# Patient Record
Sex: Male | Born: 1955 | Race: Black or African American | Hispanic: No | Marital: Married | Smoking: Light tobacco smoker
Health system: Southern US, Community
[De-identification: ages and names within clinical notes are randomized; demographics above are authoritative.]

## PROBLEM LIST (undated history)

## (undated) DIAGNOSIS — I2510 Atherosclerotic heart disease of native coronary artery without angina pectoris: Secondary | ICD-10-CM

## (undated) DIAGNOSIS — R7302 Impaired glucose tolerance (oral): Secondary | ICD-10-CM

## (undated) DIAGNOSIS — I10 Essential (primary) hypertension: Secondary | ICD-10-CM

## (undated) DIAGNOSIS — E785 Hyperlipidemia, unspecified: Secondary | ICD-10-CM

## (undated) DIAGNOSIS — N529 Male erectile dysfunction, unspecified: Secondary | ICD-10-CM

## (undated) HISTORY — DX: Impaired glucose tolerance (oral): R73.02

## (undated) HISTORY — DX: Hyperlipidemia, unspecified: E78.5

## (undated) HISTORY — DX: Male erectile dysfunction, unspecified: N52.9

## (undated) HISTORY — DX: Essential (primary) hypertension: I10

## (undated) HISTORY — DX: Atherosclerotic heart disease of native coronary artery without angina pectoris: I25.10

---

## 2008-11-05 ENCOUNTER — Emergency Department (HOSPITAL_COMMUNITY): Admission: EM | Admit: 2008-11-05 | Discharge: 2008-11-05 | Payer: Self-pay | Admitting: Emergency Medicine

## 2010-11-06 ENCOUNTER — Inpatient Hospital Stay (HOSPITAL_COMMUNITY)
Admission: RE | Admit: 2010-11-06 | Discharge: 2010-11-09 | Payer: Self-pay | Source: Home / Self Care | Attending: Cardiovascular Disease | Admitting: Cardiovascular Disease

## 2010-11-08 LAB — LIPID PANEL
Cholesterol: 225 mg/dL — ABNORMAL HIGH (ref 0–200)
HDL: 47 mg/dL (ref >39)
LDL Cholesterol: 145 mg/dL — ABNORMAL HIGH (ref 0–99)
Total CHOL/HDL Ratio: 4.8 RATIO
Triglycerides: 163 mg/dL — ABNORMAL HIGH (ref <150)
VLDL: 33 mg/dL (ref 0–40)

## 2010-11-08 LAB — TSH: TSH: 0.402 u[IU]/mL (ref 0.350–4.500)

## 2010-11-08 LAB — BASIC METABOLIC PANEL
BUN: 10 mg/dL (ref 6–23)
BUN: 10 mg/dL (ref 6–23)
CO2: 24 mEq/L (ref 19–32)
CO2: 26 mEq/L (ref 19–32)
Calcium: 8.8 mg/dL (ref 8.4–10.5)
Calcium: 8.9 mg/dL (ref 8.4–10.5)
Chloride: 108 mEq/L (ref 96–112)
Chloride: 109 mEq/L (ref 96–112)
Creatinine, Ser: 1.22 mg/dL (ref 0.4–1.5)
Creatinine, Ser: 1.34 mg/dL (ref 0.4–1.5)
GFR calc Af Amer: >60 mL/min (ref >60)
GFR calc Af Amer: >60 mL/min (ref >60)
GFR calc non Af Amer: >60 mL/min (ref >60)
GFR calc non Af Amer: 56 mL/min — ABNORMAL LOW (ref >60)
Glucose, Bld: 97 mg/dL (ref 70–99)
Glucose, Bld: 137 mg/dL — ABNORMAL HIGH (ref 70–99)
Potassium: 3.8 mEq/L (ref 3.5–5.1)
Potassium: 3.4 mEq/L — ABNORMAL LOW (ref 3.5–5.1)
Sodium: 138 mEq/L (ref 135–145)
Sodium: 140 mEq/L (ref 135–145)

## 2010-11-08 LAB — PLATELET COUNT: Platelets: 202 10*3/uL (ref 150–400)

## 2010-11-08 LAB — MRSA PCR SCREENING: MRSA by PCR: NEGATIVE

## 2010-11-08 LAB — CBC
HCT: 45.5 % (ref 39.0–52.0)
Hemoglobin: 15.3 g/dL (ref 13.0–17.0)
MCH: 27.1 pg (ref 26.0–34.0)
MCHC: 33.6 g/dL (ref 30.0–36.0)
MCV: 80.7 fL (ref 78.0–100.0)
Platelets: 192 10*3/uL (ref 150–400)
RBC: 5.64 MIL/uL (ref 4.22–5.81)
RDW: 14.4 % (ref 11.5–15.5)
WBC: 8.7 10*3/uL (ref 4.0–10.5)

## 2010-11-08 LAB — HEMOGLOBIN A1C
Hgb A1c MFr Bld: 5.9 % — ABNORMAL HIGH (ref <5.7)
Mean Plasma Glucose: 123 mg/dL — ABNORMAL HIGH (ref <117)

## 2010-11-08 LAB — TROPONIN I: Troponin I: 71 ng/mL (ref 0.00–0.06)

## 2010-11-08 LAB — CK TOTAL AND CKMB (NOT AT ARMC)
CK, MB: 245.1 ng/mL (ref 0.3–4.0)
Relative Index: 11 — ABNORMAL HIGH (ref 0.0–2.5)
Total CK: 2220 U/L — ABNORMAL HIGH (ref 7–232)

## 2010-11-10 LAB — PROTIME-INR
INR: 1.13 (ref 0.00–1.49)
Prothrombin Time: 14.7 seconds (ref 11.6–15.2)

## 2010-11-10 LAB — CBC
HCT: 45.3 % (ref 39.0–52.0)
HCT: 44.5 % (ref 39.0–52.0)
Hemoglobin: 15.1 g/dL (ref 13.0–17.0)
Hemoglobin: 14.5 g/dL (ref 13.0–17.0)
MCH: 27.1 pg (ref 26.0–34.0)
MCH: 26.6 pg (ref 26.0–34.0)
MCHC: 33.3 g/dL (ref 30.0–36.0)
MCHC: 32.6 g/dL (ref 30.0–36.0)
MCV: 81.3 fL (ref 78.0–100.0)
MCV: 81.7 fL (ref 78.0–100.0)
Platelets: 187 10*3/uL (ref 150–400)
Platelets: 204 10*3/uL (ref 150–400)
RBC: 5.57 MIL/uL (ref 4.22–5.81)
RBC: 5.45 MIL/uL (ref 4.22–5.81)
RDW: 14.6 % (ref 11.5–15.5)
RDW: 14.3 % (ref 11.5–15.5)
WBC: 7.9 10*3/uL (ref 4.0–10.5)
WBC: 8 10*3/uL (ref 4.0–10.5)

## 2010-11-10 LAB — CARDIAC PANEL(CRET KIN+CKTOT+MB+TROPI)
CK, MB: 20.6 ng/mL (ref 0.3–4.0)
Relative Index: 4.2 — ABNORMAL HIGH (ref 0.0–2.5)
Total CK: 485 U/L — ABNORMAL HIGH (ref 7–232)
Troponin I: 17.96 ng/mL (ref 0.00–0.06)

## 2010-11-10 LAB — POCT I-STAT, CHEM 8
BUN: 15 mg/dL (ref 6–23)
Calcium, Ion: 1.11 mmol/L — ABNORMAL LOW (ref 1.12–1.32)
Chloride: 102 mEq/L (ref 96–112)
Creatinine, Ser: 0.9 mg/dL (ref 0.4–1.5)
Glucose, Bld: 97 mg/dL (ref 70–99)
HCT: 49 % (ref 39.0–52.0)
Hemoglobin: 16.7 g/dL (ref 13.0–17.0)
Potassium: 3 mEq/L — ABNORMAL LOW (ref 3.5–5.1)
Sodium: 136 mEq/L (ref 135–145)
TCO2: 20 mmol/L (ref 0–100)

## 2010-11-10 LAB — BASIC METABOLIC PANEL
BUN: 14 mg/dL (ref 6–23)
BUN: 12 mg/dL (ref 6–23)
CO2: 24 mEq/L (ref 19–32)
CO2: 28 mEq/L (ref 19–32)
Calcium: 8.6 mg/dL (ref 8.4–10.5)
Calcium: 8.9 mg/dL (ref 8.4–10.5)
Chloride: 107 mEq/L (ref 96–112)
Chloride: 108 mEq/L (ref 96–112)
Creatinine, Ser: 1.25 mg/dL (ref 0.4–1.5)
Creatinine, Ser: 1.27 mg/dL (ref 0.4–1.5)
GFR calc Af Amer: >60 mL/min (ref >60)
GFR calc Af Amer: >60 mL/min (ref >60)
GFR calc non Af Amer: >60 mL/min (ref >60)
GFR calc non Af Amer: 60 mL/min — ABNORMAL LOW (ref >60)
Glucose, Bld: 112 mg/dL — ABNORMAL HIGH (ref 70–99)
Glucose, Bld: 111 mg/dL — ABNORMAL HIGH (ref 70–99)
Potassium: 3.5 mEq/L (ref 3.5–5.1)
Potassium: 3.6 mEq/L (ref 3.5–5.1)
Sodium: 136 mEq/L (ref 135–145)
Sodium: 141 mEq/L (ref 135–145)

## 2010-11-11 ENCOUNTER — Encounter: Payer: Self-pay | Admitting: Cardiovascular Disease

## 2010-11-12 DIAGNOSIS — Z720 Tobacco use: Secondary | ICD-10-CM | POA: Insufficient documentation

## 2010-11-12 DIAGNOSIS — F17200 Nicotine dependence, unspecified, uncomplicated: Secondary | ICD-10-CM | POA: Insufficient documentation

## 2010-11-12 DIAGNOSIS — I10 Essential (primary) hypertension: Secondary | ICD-10-CM | POA: Insufficient documentation

## 2010-11-15 ENCOUNTER — Ambulatory Visit
Admission: RE | Admit: 2010-11-15 | Discharge: 2010-11-15 | Payer: Self-pay | Source: Home / Self Care | Attending: Physician Assistant | Admitting: Physician Assistant

## 2010-11-15 ENCOUNTER — Encounter: Payer: Self-pay | Admitting: Physician Assistant

## 2010-11-15 ENCOUNTER — Other Ambulatory Visit: Payer: Self-pay | Admitting: Physician Assistant

## 2010-11-15 DIAGNOSIS — I252 Old myocardial infarction: Secondary | ICD-10-CM | POA: Insufficient documentation

## 2010-11-15 DIAGNOSIS — I2510 Atherosclerotic heart disease of native coronary artery without angina pectoris: Secondary | ICD-10-CM | POA: Insufficient documentation

## 2010-11-15 DIAGNOSIS — E785 Hyperlipidemia, unspecified: Secondary | ICD-10-CM | POA: Insufficient documentation

## 2010-11-16 LAB — BASIC METABOLIC PANEL
BUN: 16 mg/dL (ref 6–23)
CO2: 27 mEq/L (ref 19–32)
Calcium: 9.2 mg/dL (ref 8.4–10.5)
Chloride: 108 mEq/L (ref 96–112)
Creatinine, Ser: 1.4 mg/dL (ref 0.4–1.5)
GFR: 70.64 mL/min (ref >60.00)
Glucose, Bld: 82 mg/dL (ref 70–99)
Potassium: 4.4 mEq/L (ref 3.5–5.1)
Sodium: 140 mEq/L (ref 135–145)

## 2010-11-23 NOTE — Procedures (Signed)
NAME:  Peter Skinner, Peter Skinner               ACCOUNT NO.:  [id number]  MEDICAL RECORD NO.:  [id number]          PATIENT TYPE:  OIB  LOCATION:  2910                         FACILITY:  [facility]  PHYSICIAN:  Veverly Fells. Excell Seltzer, MD  DATE OF BIRTH:  [birth year]  DATE OF PROCEDURE:  11/06/2010 DATE OF DISCHARGE:                           CARDIAC CATHETERIZATION   PROCEDURES: 1. Left heart catheterization. 2. Selective coronary angiography. 3. Left ventricular angiography. 4. Percutaneous transluminal coronary angioplasty and stenting of the     left anterior descending artery.  PROCEDURAL INDICATIONS:  Peter Skinner is a 55 year old gentleman with no past cardiac history who presented with an anterolateral myocardial infarction.  He was brought directly from the field after a Code STEMI was activated.  Risks and indications of the procedure were reviewed with the patient, emergency consent was obtained.  The right wrist was prepped, draped and anesthetized with 1% lidocaine.  Using the modified Seldinger technique, a 6-French sheath was placed in the right radial artery via a front wall puncture.  A 4000 units of heparin had been administered for anticoagulation, 324 mg of aspirin was given prior to arrival and 60 mg of prasugrel was given just before the patient underwent catheterization.  Bivalirudin was started soon after access as well. The right coronary artery was first imaged with a JR-4 catheter and left coronary artery was imaged directly with an XB LAD 3.5-cm guide catheter.  Ventriculography was performed with pigtail catheter. Following diagnostic angiography, PCI was performed.  DIAGNOSTIC FINDINGS:  Aortic pressure 137/72 with a mean of 112, left ventricular pressure 136/26.  Left ventriculography shows mild anteroapical hypokinesis.  The LV ejection fraction is preserved at 60%.  CORONARY ANGIOGRAPHY:  The right coronary artery has diffuse luminal irregularities.  There is  nonobstructive disease throughout the proximal and mid right coronary artery.  There is a moderate-sized acute marginal branch present.  The distal right coronary artery just before the bifurcation of the PDA and posterior AV segment has a 90% eccentric stenosis present.  Left coronary artery:  The left main is patent.  There is mild distal left main stenosis of no more than 20-30%.  LAD:  The LAD is of large caliber.  There are diffuse irregularities. The proximal LAD at the first diagonal origin has an 80-90% stenosis. The mid-LAD has heavy thrombus with a large filling defect at the origin of the second diagonal.  The filling defect extends into the second diagonal branch.  The mid and distal LAD are patent without high-grade obstructive disease and the LAD wraps around the left ventricular apex. The diagonal branches of the LAD are both patent.  Left circumflex:  The left circumflex has mild nonobstructive disease. The mid circumflex has a 30-40% stenosis.  The first OM is patent without significant stenosis.  The AV groove circumflex beyond the OM is also patent without significant stenosis.  INTERVENTIONAL NOTE:  After a therapeutic ACT was achieved, a Cougar guidewire was advanced into the apical portion of the LAD.  Aspiration thrombectomy was performed with an Export catheter.  This seemed to reduce the thrombus burden over the second lesion.  Both lesions were in close proximity at the origins of the first and second diagonals and I thought they could be covered with one stent, a 3.0 x 28-mm Promus Element stent was chosen and carefully positioned.  The stent was deployed at 11 atmospheres and appeared well expanded.  The stent was then postdilated to 14 atmospheres with a 3.25 x 20-mm [place] Quantum apex. Of note, prior to aspiration thrombectomy, intravenous eptifibatide was started via a double bolus and drip protocol.  At the completion of the interventional procedure, the  stenosis was reduced from 90% down to 0%.  Flow was TIMI 3 both pre and post.  There was residual thrombus in the second diagonal branch, but it did not appear flow obstructive and there was TIMI 3 flow.  The patient tolerated the entire procedure well and there were no immediate complications.  A TR band was used for radial artery hemostasis.  FINAL ASSESSMENT: 1. Critical left anterior descending artery stenosis with heavy     thrombus burden. 2. Severe distal right coronary artery stenosis. 3. Nonobstructive left circumflex stenosis. 4. Preserved left ventricular function. 5. Successful percutaneous intervention of the left anterior     descending artery using aspiration thrombectomy followed by     stenting with a drug-eluting stent platform.  RECOMMENDATIONS:  The patient will continue with aspirin, Effient, and Integrilin.  We will plan on staged PCI of the right coronary artery on Monday.     Veverly Fells. Excell Seltzer, MD     MDC/MEDQ  D:  11/06/2010  T:  11/07/2010  Job:  [id number]  Electronically Signed by Tonny Bollman MD on 11/23/2010 04:47:55 AM

## 2010-11-23 NOTE — H&P (Signed)
NAME:  EARLEY, GROBE               ACCOUNT NO.:  [id number]  MEDICAL RECORD NO.:  [id number]          PATIENT TYPE:  OIB  LOCATION:  2910                         FACILITY:  [facility]  PHYSICIAN:  Veverly Fells. Excell Seltzer, MD  DATE OF BIRTH:  06/20/1956  DATE OF ADMISSION:  11/06/2010 DATE OF DISCHARGE:                             HISTORY & PHYSICAL   CHIEF COMPLAINT:  Chest pain.  HISTORY OF PRESENT ILLNESS:  Mr. Kinney is a 55 year old gentleman with minimal past medical history who developed the abrupt onset of substernal chest pain this morning at 8 a.m.  He experienced associated nausea and vomiting as well as diaphoresis.  He eventually went to [facility] and was found to have an EKG consistent with an anterior MI.  EMS was called and he was brought directly to the cath lab after a code STEMI was activated.  The patient has minimal chest pain on arrival.  He denies any preceding symptoms of chest pain with exertion prior to this morning.  He is felt in his normal state of health over the past few days.  He has no other complaints on admission except for very mild ongoing chest pain.  He denies dyspnea, syncope, or palpitations.  PAST MEDICAL HISTORY:  Pertinent only for essential hypertension and tobacco use.  He denies any history of dyslipidemia, diabetes, or past surgeries.  FAMILY HISTORY:  This is negative for premature coronary artery disease. He does have some cardiac history in his parents, but he is unclear of the specifics and states that they have not had heart attacks.  SOCIAL HISTORY:  The patient works in the Radio broadcast assistant.  He is married and has grown children.  He does smoke cigarettes and he is a long-time smoker.  He drinks alcohol on weekends, but is not a daily drinker of alcohol.  REVIEW OF SYSTEMS:  Negative except as per HPI.  PHYSICAL EXAMINATION:  GENERAL:  The patient is alert and oriented.  He is in mild  distress. VITAL SIGNS:  Blood pressure 137/84, heart rate is 85, respiratory rate is 20, oxygen saturation is 100% on 2 L of oxygen per nasal cannula. HEENT:  Normal. NECK:  Normal carotid upstrokes.  No bruits.  JVP normal.  No thyromegaly or thyroid nodules. LUNGS:  Clear to auscultation bilaterally. HEART:  Regular rate and rhythm.  No murmurs or gallops.  The apex is discrete and nondisplaced. BACK:  No flank tenderness. ABDOMEN:  Soft, nontender.  No organomegaly.  No bruits. EXTREMITIES:  No clubbing, cyanosis, or edema.  Peripheral pulses were intact and equal bilaterally. SKIN:  Warm and dry without rash. NEUROLOGIC:  Cranial nerves II through XII are intact.  Strength intact and equal.  IMAGING DATA:  EKG shows normal sinus rhythm with acute anterolateral injury.  ASSESSMENT/PLAN:  This is a 55 year old gentleman with an acute anterior wall myocardial infarction.  He presents directly for emergency cardiac catheterization plus or minus PCI.  Emergency consent was obtained.  The procedure was described to the patient as well as his wife.  Further plans pending the results of his catheterization.  We  will plan a right radial approach.  The patient is being given heparin, aspirin, and Prasugrel as he gets on to the table.  He actually received 324 mg of aspirin and this was documented at [facility].  He was given 60 mg of Effient in the cath lab and 4000 units of heparin prior to the start of the procedure.  We will focus on risk reduction measures with high-dose statin, we will check the patient's lipids, and we will use a beta-blocker for post MI care and treatment of hypertension.  We will add an ACE inhibitor as his blood pressure tolerates.     Veverly Fells. Excell Seltzer, MD     MDC/MEDQ  D:  11/06/2010  T:  11/06/2010  Job:  [id number]  cc:   Dr. Creta Levin  Electronically Signed by Tonny Bollman MD on 11/23/2010 04:47:49 AM

## 2010-11-23 NOTE — Procedures (Signed)
  NAME:  Peter Skinner, Peter Skinner               ACCOUNT NO.:  [id number]  MEDICAL RECORD NO.:  [id number]          PATIENT TYPE:  OIB  LOCATION:  2910                         FACILITY:  [facility]  PHYSICIAN:  Veverly Fells. Excell Seltzer, MD  DATE OF BIRTH:  08-20-1956  DATE OF PROCEDURE:  11/08/2010 DATE OF DISCHARGE:                           CARDIAC CATHETERIZATION   PROCEDURE:  Stenting of the right coronary artery.  OPERATOR:  Veverly Fells. Excell Seltzer, MD  Hilarie Fredrickson INDICATIONS:  Peter Skinner is a 55 year old gentleman who came in on November 06, 2010 with an anterior wall ST-elevation MI.  At the time of his catheterization, he was found to have severe stenosis of his distal right coronary artery and he presented today for staged PCI.  DESCRIPTION OF PROCEDURE:  Risks, indications of the procedure were reviewed with the patient.  Informed consent was obtained.  The left wrist was prepped, draped, and anesthetized with 1% lidocaine.  The left radial artery was accessed with a moderate amount of difficulty.  A 6- French sheath was placed.  Bivalirudin was used for anticoagulation. The patient has been adequately preloaded with aspirin and Effient. Initially, a JR-4 guide catheter was used but there was a high anterior origin of the right coronary artery that could not be reached.  An AL-1 catheter was used and fit the vessel well.  A Cougar guidewire was advanced beyond the area of stenosis and once the therapeutic ACT was achieved, the severe lesion in the distal vessel was treated with a 2.75 x 16 mm Promus drug-eluting stent.  The stent was deployed at 11 atmospheres and was positioned just to the bifurcation of the PDA and the posterior AV segment.  The stent was postdilated with a 3.0 x 15mm [place] Quantum apex balloon which was dilated to 14 atmospheres.  There was an excellent angiographic result at the completion of the procedure. There were no immediate complications.  There was TIMI 3 flow present and  0% residual stenosis.  A TR band was used for radial hemostasis.  FINAL CONCLUSIONS:  Successful percutaneous intervention of the right coronary artery using a drug-eluting stent platform.  RECOMMENDATIONS:  The patient will remain on aspirin and Effient for greater than 12 months as well as post MI medical therapy.     Veverly Fells. Excell Seltzer, MD    MDC/MEDQ  D:  11/08/2010  T:  11/09/2010  Job:  [id number]  cc:   Warrick Parisian, M.D  Electronically Signed by Tonny Bollman MD on 11/23/2010 04:48:02 AM

## 2010-11-23 NOTE — Discharge Summary (Signed)
NAME:  Peter Skinner, Peter Skinner               ACCOUNT NO.:  [id number]  MEDICAL RECORD NO.:  [id number]          PATIENT TYPE:  OIB  LOCATION:  6524                         FACILITY:  [facility]  PHYSICIAN:  Veverly Fells. Excell Seltzer, MD  DATE OF BIRTH:  Mar 22, 1956  DATE OF ADMISSION:  11/06/2010 DATE OF DISCHARGE:  11/09/2010                              DISCHARGE SUMMARY   PRIMARY CARDIOLOGIST:  Veverly Fells. Excell Seltzer, MD  DISCHARGE DIAGNOSIS:  Anterior ST-elevation myocardial infarction status post drug-eluting stent x2 to the left anterior descending and right coronary artery.  SECONDARY DIAGNOSES: 1. Coronary artery disease. 2. Hypertension. 3. Dyslipidemia. 4. Tobacco abuse.  PROCEDURES/DIAGNOSTICS PERFORMED DURING HOSPITALIZATION: 1. Left heart catheterization with selective coronary angiography and     left ventricular angiography.     a.     Status post percutaneous transluminal coronary angioplasty      and stenting of the left anterior descending artery with 3.8 x 28      mm Promus Element stent on November 06, 2010.     b.     Stenting of the right coronary artery with a Promus 2.75 x      16 mm drug-eluting stent. 2. Chest x-ray showing no acute cardiopulmonary findings.  ALLERGIES:  No known drug allergies.  HISTORY OF PRESENT ILLNESS:  This is a 55 year old gentleman with the above-stated problems who presented to [facility] with complaints of substernal chest pain, nausea, vomiting, and diaphoresis, and was found on EKG consistent with an anterior myocardial infarction. EMS arrived and a code STEMI was activated.  The patient was brought emergently to the cardiac cath lab where emergent consent was obtained.  HOSPITAL COURSE:  The patient was brought to the cath lab and given heparin, aspirin, and prasugrel as he was placed on the cath table.  Dr. Excell Seltzer performed catheterization and there was critical left anterior descending artery stenosis with a heavy thrombus  burden.  There was successful percutaneous intervention of the left anterior descending artery using aspiration thrombectomy followed by stenting with drug- eluting stent platform.  It was also noted that the patient had severe distal right coronary artery stenosis.  Therefore, there will be a staged PCI of the right coronary artery on Monday.  The patient tolerated the procedure well and was continued on aspirin, Effient, and Integrilin.  The patient had no further complaints of chest pain.  His right radial site was without evidence of hematoma.  The patient was noted to be hypertensive.  Therefore, his beta-blocker, Coreg was increased prior to planned PCI of the RCA.  On November 08, 2010, Dr. Excell Seltzer brought the patient back to the cath lab for staged PCI. Informed consent was obtained.  There was successful percutaneous intervention of the right coronary artery using a drug-eluting stent platform.  The patient tolerated the procedure well.  The patient will remain on aspirin and Effient for greater than 12 months as well as his post myocardial infarction medical therapy.  The patient's left radial site was without evidence of hematoma.  He had no further complaints of chest pain.  He was able to ambulate with cardiac  rehab without difficulty.  The patient is also going to participate in outpatient cardiac rehab.  The patient also smoked half pack a day.  Tobacco cessation consult was obtained and the patient voiced willingness to quit smoking.  At this point, he is going to try to quit cold [place] and is not interested in using any medical aids.  This has been encouraged.  On the day of discharge, Dr. Excell Seltzer evaluated the patient, and noted him stable for home.  Again he was able to ambulate without chest pain. There was a long discussion of medical adherence, tobacco cessation, diet, and exercise.  The patient voiced understanding.  The patient will follow up with Dr. Excell Seltzer in 2  weeks and anticipate return to work in 3- 4 weeks.  DISCHARGE LABS:  WBC of 8, hemoglobin 14.5, hematocrit 44.5, platelet 204.  Sodium 141, potassium 3.6, chloride 108, bicarb 28, BUN 12, creatinine 1.27, max troponin of 17.96.  DISCHARGE MEDICATIONS: 1. Aspirin 325 mg daily. 2. Carvedilol 25 mg twice daily. 3. Lisinopril 2 mg daily. 4. Prasugrel 10 mg 1 tablet daily. 5. Crestor 40 mg daily. 6. Please stop taking Azor.  FOLLOWUP PLANS AND INSTRUCTIONS: 1. The patient will follow up with Tereso Newcomer, PA, in [facility]     Cardiology Office on January 23 at 2:30 p.m. 2. He is to increase activity slowly, he may shower, but no bathing.     No lifting for 1 week greater than 5 pounds.  No sexual activity     for 1 week.  He is to keep the cath site clean and dry, and call     our office for any problems. 3. The patient is to continue low-sodium heart-healthy diet. 4. The patient is to avoid any activities that causes chest pain or     shortness of breath.  DURATION OF DISCHARGE:  Greater than 30 minutes.     Leonette Monarch, PA-C   ______________________________ Veverly Fells. Excell Seltzer, MD    NB/MEDQ  D:  11/09/2010  T:  11/09/2010  Job:  [id number]  Electronically Signed by Alen Blew P.A. on 11/11/2010 03:27:35 PM Electronically Signed by Tonny Bollman MD on 11/23/2010 04:47:44 AM

## 2010-11-24 ENCOUNTER — Telehealth: Payer: Self-pay | Admitting: Cardiovascular Disease

## 2010-11-25 ENCOUNTER — Encounter: Payer: Self-pay | Admitting: Cardiovascular Disease

## 2010-11-25 NOTE — Assessment & Plan Note (Signed)
Summary: nph   CC:  folow up.  History of Present Illness: Primary Cardiologist:  Dr. Tonny Bollman  Peter Skinner is a 55 yo male with a h/o HTN and HLP who presented to [facility] on 11/06/2010 with an anterior STEMI that was treated emergently with 2 drug eluting stents to the LAD.  He was noted to have high grade disease in his RCA at his cath as well.  He was brought back for staged PCI on 11/08/2010 with a DES to the RCA.  His LVF was preserved with EF 60%.  A1C was 5.9.  He was counseled on tobacco cessation.  He was placed on high dose Crestor and needs follow up Lipids and LFTs in several weeks.  He presents for follow up.  He denies any chest pain, shortness of breath, orthopnea, PND or edema.  He denies palpitations or syncope.  He denies fatigue or lightheadedness.  He's taking all his medications well.  He brings in forms today for disability from his job.  Current Medications (verified): 1)  Aspirin Ec 325 Mg Tbec (Aspirin) .... Take One Tablet By Mouth Daily 2)  Carvedilol 25 Mg Tabs (Carvedilol) .... Take One Tablet By Mouth Twice A Day 3)  Lisinopril 10 Mg Tabs (Lisinopril) .... Take One Tablet By Mouth Daily 4)  Effient 10 Mg Tabs (Prasugrel Hcl) .... Take One Tablet By Mouth Daily 5)  Crestor 40 Mg Tabs (Rosuvastatin Calcium) .... Take One Tablet By Mouth Daily.  Past History:  Past Medical History: CAD   a. Ant STEMI 11/06/2010 tx with DES x 2 to LAD   b. s/p DES to RCA 11/08/10   c. cath 11/06/10: dLM 20-30%; mCFX 30-40%; EF 60% Hyperlipidemia Hypertension Glucose intol (A1C 5.9 10/2010)  Social History:  The patient works in the Development worker, international aid.  He is married and has grown children.  He quit smoking after his MI 11/06/2010. He drinks alcohol on weekends, but is not a daily drinker of alcohol.   Review of Systems       As per  the HPI.  All other systems reviewed and negative.   Vital Signs:  Patient profile:   54 year old male Height:      69  inches Weight:      213 pounds BMI:     31.57 Pulse rate:   56 / minute Resp:     14 per minute BP sitting:   112 / 71  (left arm)  Vitals Entered By: Kem Parkinson (November 15, 2010 2:47 PM)  Physical Exam  General:  Well nourished, well developed, in no acute distress HEENT: normal Neck: no JVD Cardiac:  normal S1, S2; RRR; no murmur Lungs:  clear to auscultation bilaterally, no wheezing, rhonchi or rales Abd: soft, nontender, no hepatomegaly Ext: no  edema; right radial site without hematoma or bruit Vascular: no carotid  bruits Skin: warm and dry Neuro:  CNs 2-12 intact, no focal abnormalities noted    EKG  Procedure date:  11/15/2010  Findings:      Sinus Bradycardia Heart rate 56 Normal axis Evolving Ant MI with T-wave inversions in leads V2-V6 as well as one and aVL No significant change compared to tracing dated 11/09/2010  Impression & Recommendations:  Problem # 1:  ACUT MYOCARD INFARCT OTH ANT WALL EPIS CARE UNS (ICD-410.10) He is doing well overall.  He denies any further chest pain or shortness of breath.  He will continue on aspirin and Effient.  We discussed the importance of these medications.  He is interested in cardiac rehabilitation and will contact them to arrange an appointment.  He needs disability forms filled out for his job.  It was recommended that he return to work 4 weeks post MI.  Therefore, his first day back to work should be around February 14.  I recommend he go back at half-time for one week and then increase to full time after that as long as he feels well.  Orders: EKG w/ Interpretation (93000) TLB-BMP (Basic Metabolic Panel-BMET) (80048-METABOL)  Problem # 2:  CORONARY ARTERY DISEASE (ICD-414.00) As above continue aspirin, Effient and statin therapy.  Orders: EKG w/ Interpretation (93000) TLB-BMP (Basic Metabolic Panel-BMET) (80048-METABOL)  Problem # 3:  HYPERLIPIDEMIA (ICD-272.4) Continue Crestor.  We will get followup  lipids and LFTs in 6-8 weeks.  Problem # 4:  HYPERTENSION (ICD-401.9) Controlled.  He will have a basic metabolic panel obtained today as lisinopril is new for him.  Orders: TLB-BMP (Basic Metabolic Panel-BMET) (80048-METABOL)  Problem # 5:  Bradycardia He is asymptomatic.  Continue current meds.  Patient Instructions: 1)  Labwork today: bmet (410.10;414.00) 2)  You will need to return for FASTING labwork in 6-8 weeks: lipid/liver (272.4). 3)  Your physician recommends that you schedule a follow-up appointment in: 6-8 weeks with Dr. Excell Seltzer.

## 2010-11-26 ENCOUNTER — Telehealth: Payer: Self-pay | Admitting: Cardiovascular Disease

## 2010-11-29 ENCOUNTER — Telehealth (INDEPENDENT_AMBULATORY_CARE_PROVIDER_SITE_OTHER): Payer: Self-pay | Admitting: *Deleted

## 2010-11-29 ENCOUNTER — Encounter: Payer: Self-pay | Admitting: Cardiovascular Disease

## 2010-12-01 NOTE — Letter (Signed)
Summary: Return To Work  [employer], Main Office  [address]   [place], [place] [address]   Phone: [phone]  Fax: [phone]    11/25/2010  TO: Leodis Sias IT MAY CONCERN   RE: Peter Skinner [address] [facility] [name]   The above named individual is under my medical care and may return to work on December 07, 2010. It is our recommendation that the patient return part-time for one week and then increase to full-time.   If you have any further questions or need additional information, please call.     Sincerely,    Tereso Newcomer, PA-C Julieta Gutting, RN, BSN

## 2010-12-01 NOTE — Progress Notes (Signed)
Summary: Need Doc note for work  Phone Note Call from Patient Call back at [phone] or [phone]   Caller: Mom Summary of Call: Pt need a Doc notes for work stating when he can return to work was seen on 11/15/10 Initial call taken by: Judie Grieve,  November 24, 2010 10:52 AM  Follow-up for Phone Call        Left message at both numbers for pt to call back. Julieta Gutting, RN, BSN  November 24, 2010 3:24 PM  I spoke with the pt and he would like note for RTW.  Julieta Gutting, RN, BSN  November 25, 2010 10:55 AM

## 2010-12-01 NOTE — Letter (Signed)
Summary: Cardiac Rehab Program  Cardiac Rehab Program   Imported By: Marylou Mccoy 11/25/2010 11:57:14  _____________________________________________________________________  External Attachment:    Type:   Image     Comment:   External Document

## 2010-12-01 NOTE — Progress Notes (Signed)
Summary: Fluttering in Upper Right Chest  Phone Note Call from Patient Call back at [phone]   Caller: Patient Reason for Call: Talk to Nurse Summary of Call: PT HAS PAIN RIGHT PART OF HIS CHEST STARTED 30 MINS AGO. NO SOB. Initial call taken by: Roe Coombs,  November 26, 2010 4:48 PM  Follow-up for Phone Call        I spoke with the pt and he did not have CP.  The pt said he had a fluttering sensation in the upper right chest.  The pt is fine now and symptoms have resolved.  Symptoms were not similar to what he felt prior to stenting.  The pt did not have palpitations.  The pt will call the office if he has any other questions or concerns.  Follow-up by: Julieta Gutting, RN, BSN,  November 26, 2010 5:27 PM

## 2010-12-09 NOTE — Progress Notes (Signed)
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    Thank you and have a good weekend, Elease Hashimoto at [employer]. ------------------------------

## 2011-01-04 ENCOUNTER — Ambulatory Visit (INDEPENDENT_AMBULATORY_CARE_PROVIDER_SITE_OTHER): Payer: BC Managed Care – PPO | Admitting: Cardiovascular Disease

## 2011-01-04 ENCOUNTER — Encounter: Payer: Self-pay | Admitting: Cardiovascular Disease

## 2011-01-04 ENCOUNTER — Other Ambulatory Visit (INDEPENDENT_AMBULATORY_CARE_PROVIDER_SITE_OTHER): Payer: BC Managed Care – PPO

## 2011-01-04 ENCOUNTER — Other Ambulatory Visit: Payer: Self-pay | Admitting: Cardiovascular Disease

## 2011-01-04 DIAGNOSIS — I252 Old myocardial infarction: Secondary | ICD-10-CM

## 2011-01-04 DIAGNOSIS — I2510 Atherosclerotic heart disease of native coronary artery without angina pectoris: Secondary | ICD-10-CM

## 2011-01-04 DIAGNOSIS — E7800 Pure hypercholesterolemia, unspecified: Secondary | ICD-10-CM

## 2011-01-04 DIAGNOSIS — I10 Essential (primary) hypertension: Secondary | ICD-10-CM

## 2011-01-04 DIAGNOSIS — E785 Hyperlipidemia, unspecified: Secondary | ICD-10-CM

## 2011-01-04 LAB — BASIC METABOLIC PANEL
BUN: 19 mg/dL (ref 6–23)
CO2: 26 mEq/L (ref 19–32)
Calcium: 9 mg/dL (ref 8.4–10.5)
Chloride: 108 mEq/L (ref 96–112)
Creatinine, Ser: 1.3 mg/dL (ref 0.4–1.5)
GFR: 71.82 mL/min (ref >60.00)
Glucose, Bld: 97 mg/dL (ref 70–99)
Potassium: 4 mEq/L (ref 3.5–5.1)
Sodium: 141 mEq/L (ref 135–145)

## 2011-01-04 LAB — HEPATIC FUNCTION PANEL
ALT: 19 U/L (ref 0–53)
AST: 20 U/L (ref 0–37)
Albumin: 3.9 g/dL (ref 3.5–5.2)
Alkaline Phosphatase: 62 U/L (ref 39–117)
Bilirubin, Direct: 0.1 mg/dL (ref 0.0–0.3)
Total Bilirubin: 0.4 mg/dL (ref 0.3–1.2)
Total Protein: 6.3 g/dL (ref 6.0–8.3)

## 2011-01-04 LAB — LIPID PANEL
Cholesterol: 111 mg/dL (ref 0–200)
HDL: 33.2 mg/dL — ABNORMAL LOW (ref >39.00)
LDL Cholesterol: 58 mg/dL (ref 0–99)
Total CHOL/HDL Ratio: 3
Triglycerides: 100 mg/dL (ref 0.0–149.0)
VLDL: 20 mg/dL (ref 0.0–40.0)

## 2011-01-04 NOTE — Miscellaneous (Signed)
Summary: [facility]   [facility]   Imported By: Roderic Ovens 12/28/2010 15:33:11  _____________________________________________________________________  External Attachment:    Type:   Image     Comment:   External Document

## 2011-01-20 NOTE — Assessment & Plan Note (Signed)
Summary: f 6-8 week   Visit Type:  Follow-up Primary Provider:  Dr Creta Levin  CC:  f/u post-MI.  History of Present Illness: Peter Skinner is a 55 yo male with a h/o HTN and HLP who presented to [facility] on 11/06/2010 with an anterior STEMI that was treated emergently with a drug eluting stent to the LAD.  He was noted to have high grade disease in his RCA at his cath as well.  He was brought back for staged PCI on 11/08/2010 with a DES to the RCA.  His LVF was preserved with EF 60%.  A1C was 5.9.  He was counseled on tobacco cessation.  He was placed on high dose Crestor and post-MI medical therapy.  The patient is doing well at present. He denies exertional chest pain or dyspnea. He denies leg swelling, lightheadedness, or palpitations.  Current Medications (verified): 1)  Aspirin Ec 325 Mg Tbec (Aspirin) .... Take One Tablet By Mouth Daily 2)  Carvedilol 25 Mg Tabs (Carvedilol) .... Take One Tablet By Mouth Twice A Day 3)  Lisinopril 10 Mg Tabs (Lisinopril) .... Take One Tablet By Mouth Daily 4)  Effient 10 Mg Tabs (Prasugrel Hcl) .... Take One Tablet By Mouth Daily 5)  Crestor 40 Mg Tabs (Rosuvastatin Calcium) .... Take One Tablet By Mouth Daily.  Allergies (verified): No Known Drug Allergies  Past History:  Past medical history reviewed for relevance to current acute and chronic problems.  Past Medical History: CAD   a. Ant STEMI 11/06/2010 tx with DES  to LAD   b. s/p DES to RCA 11/08/10   c. cath 11/06/10: dLM 20-30%; mCFX 30-40%; EF 60% Hyperlipidemia Hypertension Glucose intol (A1C 5.9 10/2010)  Review of Systems       Negative except as per HPI   Vital Signs:  Patient profile:   55 year old male Height:      69 inches Weight:      219.50 pounds BMI:     32.53 Pulse rate:   60 / minute Pulse rhythm:   regular Resp:     18 per minute BP sitting:   150 / 90  (left arm) Cuff size:   large  Vitals Entered By: Vikki Ports (January 04, 2011 8:59 AM)  Physical  Exam  General:  Pt is alert and oriented, overweight male in no acute distress. HEENT: normal Neck: normal carotid upstrokes without bruits, JVP normal Lungs: CTA CV: RRR without murmur or gallop Abd: soft, NT, positive BS, no bruit, no organomegaly Ext: no clubbing, cyanosis, or edema. 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 Recommend decrease ASA to 81 mg in setting of prasugrel. Otherwise continue current medical program. He will f/u in 4 months.  His updated medication list for this problem includes:    Aspirin 81 Mg Tbec (Aspirin) .Marland Kitchen... Take one tablet by mouth daily    Carvedilol 25 Mg Tabs (Carvedilol) .Marland Kitchen... Take one tablet by mouth twice a day    Lisinopril 10 Mg Tabs (Lisinopril) .Marland Kitchen... Take one tablet by mouth daily    Effient 10 Mg Tabs (Prasugrel hcl) .Marland Kitchen... Take one tablet by mouth daily  Problem # 2:  HYPERLIPIDEMIA (ICD-272.4) Lipids to be drawn. Goal LDL less than 70 mg/dL.  His updated medication list for this problem includes:  Crestor 40 Mg Tabs (Rosuvastatin calcium) .Marland Kitchen... Take one tablet by mouth daily.  Orders: EKG w/ Interpretation (93000) TLB-BMP (Basic Metabolic Panel-BMET) (80048-METABOL) TLB-Lipid Panel (80061-LIPID) TLB-Hepatic/Liver Function Pnl (80076-HEPATIC)  Problem # 3:  HYPERTENSION (ICD-401.9) BP elevated but patient did not take meds this am. Continue to follow.  His updated medication list for this problem includes:    Aspirin 81 Mg Tbec (Aspirin) .Marland Kitchen... Take one tablet by mouth daily    Carvedilol 25 Mg Tabs (Carvedilol) .Marland Kitchen... Take one tablet by mouth twice a day    Lisinopril 10 Mg Tabs (Lisinopril) .Marland Kitchen... Take one tablet by mouth daily  Orders: EKG w/ Interpretation (93000) TLB-BMP (Basic Metabolic Panel-BMET) (80048-METABOL) TLB-Lipid Panel  (80061-LIPID) TLB-Hepatic/Liver Function Pnl (80076-HEPATIC)  BP today: 150/90 Prior BP: 112/71 (11/15/2010)  Labs Reviewed: K+: 4.4 (11/15/2010) Creat: : 1.4 (11/15/2010)     Problem # 4:  TOBACCO ABUSE (ICD-305.1) Discussed importance of continued abstinence from cigarettes.  Patient Instructions: 1)  Your physician recommends that you have lab work today: LIPID, LIVER and BMP 2)  Your physician has recommended you make the following change in your medication: DECREASE Aspirin to 81mg  once a day 3)  Your physician wants you to follow-up in:  4 MONTHS.  You will receive a reminder letter in the mail two months in advance. If you don't receive a letter, please call our office to schedule the follow-up appointment.

## 2011-01-24 ENCOUNTER — Telehealth: Payer: Self-pay | Admitting: Cardiology

## 2011-01-24 NOTE — Telephone Encounter (Signed)
Pt dropped off [employer] form to be completed by physician. Took form to Lauren for Dr. Excell Seltzer to complete and fax. 01/24/11 BAB

## 2011-02-14 ENCOUNTER — Telehealth: Payer: Self-pay | Admitting: Cardiovascular Disease

## 2011-02-15 NOTE — Telephone Encounter (Signed)
This form was sent to [facility] for completion.  I called [employer] and Elease Hashimoto has already left for the day.  I will try to reach her tomorrow at extension [phone].

## 2011-02-16 NOTE — Telephone Encounter (Signed)
I spoke with Peter Skinner at [facility] and they are behind in getting paperwork logged into [employer].  This form was given to [place] on Monday to send over to [facility] but at this time it has not been logged into the system.  Per Peter Skinner the form may be at [facility] but it has not been completed at this time.

## 2011-02-21 NOTE — Telephone Encounter (Signed)
Paperwork signed by Dr Excell Seltzer and sent back to Elease Hashimoto at [facility] to follow-up with patient.

## 2011-05-16 ENCOUNTER — Encounter: Payer: Self-pay | Admitting: Cardiovascular Disease

## 2011-05-20 ENCOUNTER — Encounter: Payer: Self-pay | Admitting: Cardiovascular Disease

## 2011-05-20 ENCOUNTER — Ambulatory Visit (INDEPENDENT_AMBULATORY_CARE_PROVIDER_SITE_OTHER): Payer: BC Managed Care – PPO | Admitting: Cardiovascular Disease

## 2011-05-20 DIAGNOSIS — I10 Essential (primary) hypertension: Secondary | ICD-10-CM

## 2011-05-20 DIAGNOSIS — I2510 Atherosclerotic heart disease of native coronary artery without angina pectoris: Secondary | ICD-10-CM

## 2011-05-20 DIAGNOSIS — E785 Hyperlipidemia, unspecified: Secondary | ICD-10-CM

## 2011-05-20 DIAGNOSIS — I119 Hypertensive heart disease without heart failure: Secondary | ICD-10-CM

## 2011-05-20 DIAGNOSIS — E7800 Pure hypercholesterolemia, unspecified: Secondary | ICD-10-CM

## 2011-05-20 MED ORDER — LISINOPRIL 20 MG PO TABS: 20.0000 mg | ORAL_TABLET | Freq: Every day | ORAL | Status: DC

## 2011-05-20 MED ORDER — AMLODIPINE BESYLATE 5 MG PO TABS: 5.0000 mg | ORAL_TABLET | Freq: Every day | ORAL | Status: DC

## 2011-05-20 NOTE — Patient Instructions (Signed)
Your physician has requested that you have an exercise tolerance test. For further information please visit [email or web address] Please also follow instruction sheet, as given. To be done in 6 months  Your physician recommends that you schedule a follow-up appointment in: 2 weeks for nurse room visit for blood pressure check.  Your physician has recommended you make the following change in your medication: Increase lisinopril to 20 mg by mouth daily. Start amlodipine 5 mg by mouth daily.  Your physician recommends that you return for fasting lab work in:6 months on day of treadmill.  BMP, Lipid, Liver--414.01,272.0

## 2011-05-20 NOTE — Assessment & Plan Note (Signed)
The patient is stable without angina. He should be continued with dual antiplatelet therapy using aspirin and effient. His blood pressure is out of control even on a combination of carvedilol and lisinopril. See below for discussion. I have recommended the patient return in 6 months for an exercise treadmill test.

## 2011-05-20 NOTE — Progress Notes (Signed)
HPI:  This is a 55 year old gentleman returning for followup evaluation. The patient has coronary artery disease and presented with an anterior MI earlier this year. He was treated with primary PCI using a drug-eluting stent to the LAD. The patient underwent staged intervention of the right coronary artery to treat high-grade disease in that vessel. He had a non-complicated hospitalization with preserved LV function and returns for followup.  The patient feels well overall. He is not engage in regular exercise. He does do physical work including mowing his lawn with a push mower and has no symptoms with that level of exertion. He denies chest pain, dyspnea, edema, palpitations, lightheadedness, or syncope.  Outpatient Encounter Prescriptions as of 05/20/2011  Medication Sig Dispense Refill  . aspirin 81 MG EC tablet Take 81 mg by mouth daily.        . carvedilol (COREG) 25 MG tablet Take 25 mg by mouth 2 (two) times daily.        Marland Kitchen lisinopril (PRINIVIL,ZESTRIL) 20 MG tablet Take 1 tablet (20 mg total) by mouth daily.  30 tablet  11  . prasugrel (EFFIENT) 10 MG TABS Take 10 mg by mouth daily.        . rosuvastatin (CRESTOR) 40 MG tablet Take 40 mg by mouth daily.        Marland Kitchen DISCONTD: lisinopril (PRINIVIL,ZESTRIL) 10 MG tablet Take 10 mg by mouth daily.        Marland Kitchen amLODipine (NORVASC) 5 MG tablet Take 1 tablet (5 mg total) by mouth daily.  30 tablet  11    No Known Allergies  Past Medical History  Diagnosis Date  . CAD (coronary artery disease)     ant STEMI 11/06/10 tx with DES to LAD. dLM 20-30%; mCFX 30-40%; EF 60%. s/p DES to RCA 11/08/10.   Marland Kitchen HLD (hyperlipidemia)   . HTN (hypertension)   . Glucose intolerance (impaired glucose tolerance)     A1C 5.9 1/12    ROS: Negative except as per HPI  BP 192/112  Pulse 61  Resp 16  Ht 5\' 9"  (1.753 m)  Wt 219 lb (99.338 kg)  BMI 32.34 kg/m2  PHYSICAL EXAM: Pt is alert and oriented, NAD HEENT: normal Neck: JVP - normal, carotids 2+= without  bruits Lungs: CTA bilaterally CV: RRR without murmur or gallop Abd: soft, NT, Positive BS, no hepatomegaly Ext: no C/C/E, distal pulses intact and equal Skin: warm/dry no rash  EKG:  Normal sinus rhythm heart rate 61 beats per minute, age indeterminate anterior infarct.  ASSESSMENT AND PLAN:

## 2011-05-20 NOTE — Assessment & Plan Note (Signed)
Blood pressure is uncontrolled. Recommend double lisinopril 20 mg daily and amlodipine 5 mg daily. He should continue on carvedilol 25 mg twice daily. He will return in 2 weeks for blood pressure check.

## 2011-05-20 NOTE — Assessment & Plan Note (Signed)
LDL at goal (less than 70). He will return in 6 months for lab work at the time of his exercise treadmill study. We discussed the importance of complete tobacco cessation, exercise, and dietary changes with a goal toward weight loss.

## 2011-06-06 ENCOUNTER — Ambulatory Visit (INDEPENDENT_AMBULATORY_CARE_PROVIDER_SITE_OTHER): Payer: BC Managed Care – PPO

## 2011-06-06 VITALS — BP 155/96 | HR 66 | Ht 69.0 in | Wt 218.0 lb

## 2011-06-06 DIAGNOSIS — I10 Essential (primary) hypertension: Secondary | ICD-10-CM

## 2011-06-06 DIAGNOSIS — I119 Hypertensive heart disease without heart failure: Secondary | ICD-10-CM

## 2011-06-06 NOTE — Progress Notes (Signed)
Patient in for B/P check today. Left arm B/P 155/96 right arm 149/92 pulse 66 beats/minute. Lisinopril medication dose was increased to 20 mg daily, and started on Amlodipine 5 mg once daily. Patient has not taken medications this AM, he usually takes them at noon. Patient has no C/O at this time. Charlotte Crumb RN aware.

## 2011-06-08 NOTE — Progress Notes (Signed)
BP reviewed from nurse visit followup and pressure is better but still elevated. Recommend increase amlodipine to 10 mg daily.

## 2011-06-16 MED ORDER — AMLODIPINE BESYLATE 5 MG PO TABS: 10.0000 mg | ORAL_TABLET | Freq: Every day | ORAL | Status: DC

## 2011-06-16 MED ORDER — ROSUVASTATIN CALCIUM 40 MG PO TABS: 40.0000 mg | ORAL_TABLET | Freq: Every day | ORAL | Status: DC

## 2011-06-16 MED ORDER — CARVEDILOL 25 MG PO TABS: 25.0000 mg | ORAL_TABLET | Freq: Two times a day (BID) | ORAL | Status: DC

## 2011-06-16 MED ORDER — PRASUGREL HCL 10 MG PO TABS: 10.0000 mg | ORAL_TABLET | Freq: Every day | ORAL | Status: DC

## 2011-06-16 NOTE — Progress Notes (Signed)
Addended by: Harriet Butte on: 06/16/2011 04:07 PM   Modules accepted: Orders

## 2011-06-16 NOTE — Progress Notes (Signed)
Prescription for Amlodipine 10 mg once a day send to pharmacy patient aware.

## 2011-08-27 IMAGING — CR DG CHEST 1V PORT
1 series · 1 of 1 positions shown · non-contrast
Comparison: None

CLINICAL DATA: Cardiac catheterization.  Chest pain.

PORTABLE CHEST - 1 VIEW

[AP]
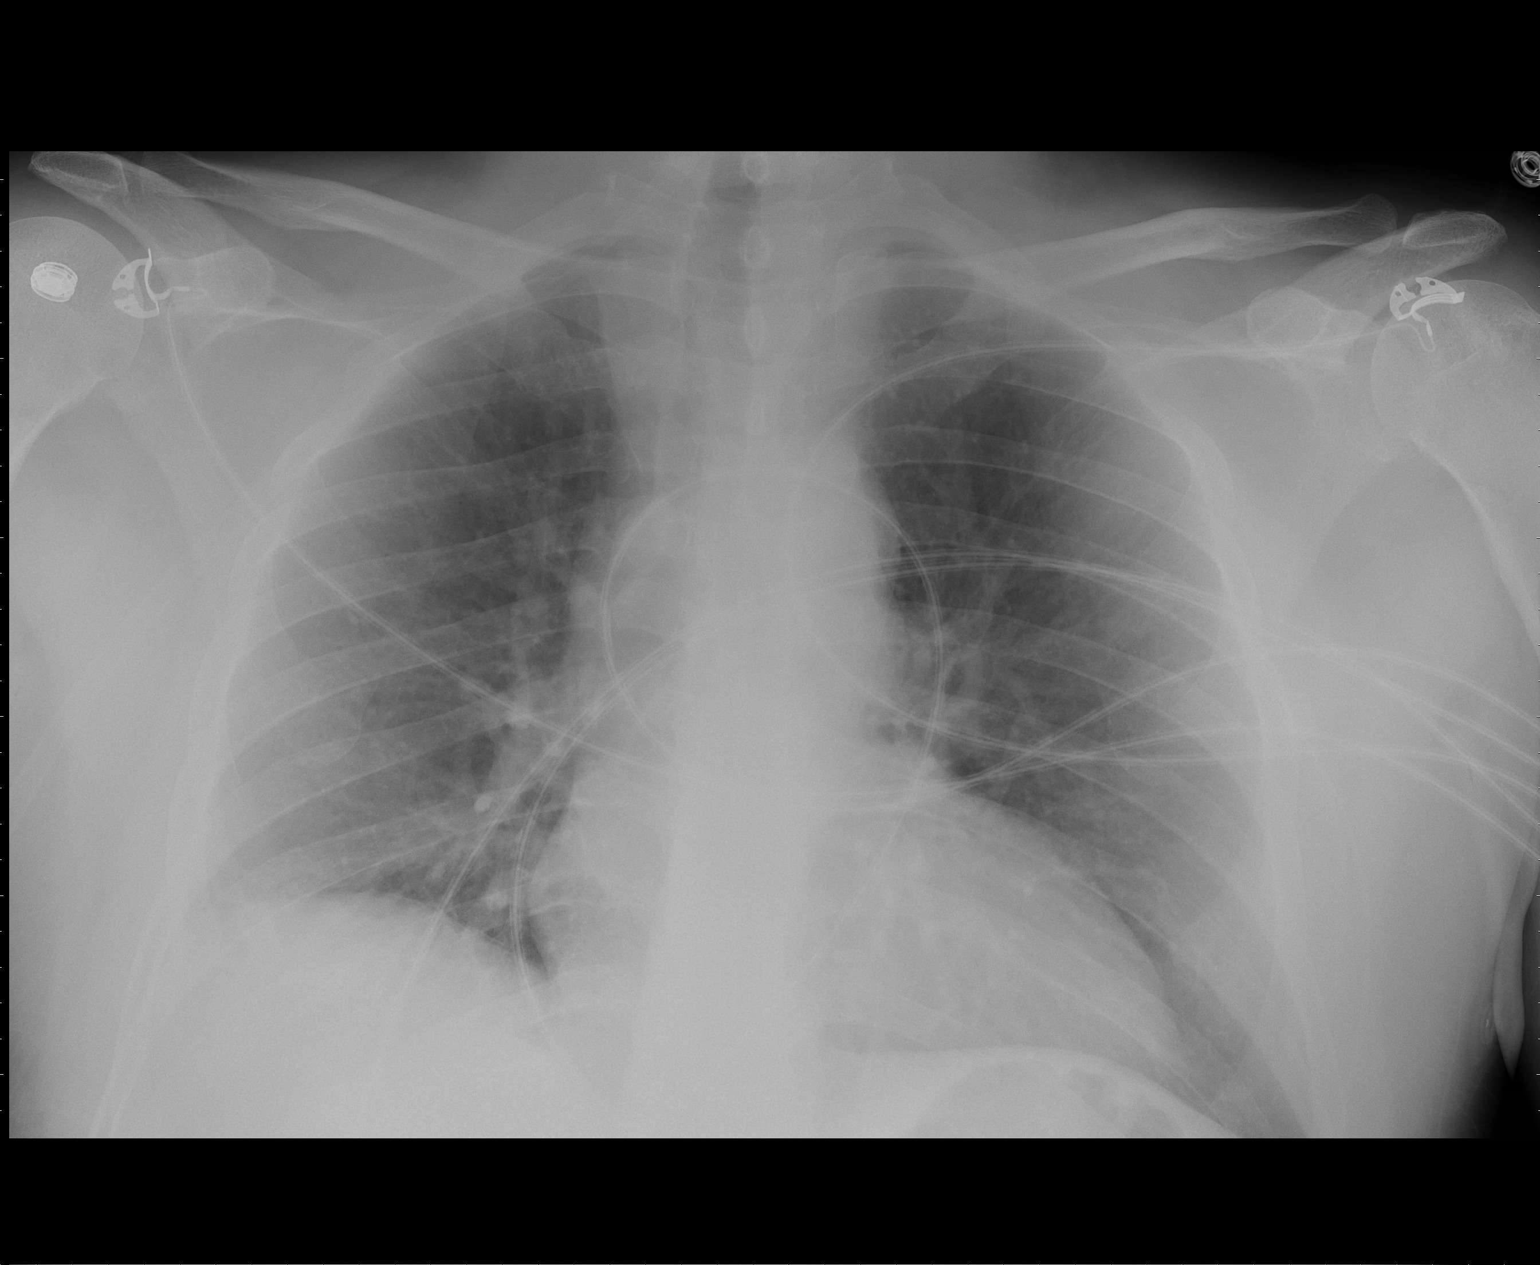

[1 of 1 positions shown; findings below may reference images not displayed]

FINDINGS: Given the projection, heart size is within normal limits.
Low lung volumes are present, causing crowding of the pulmonary
vasculature.  No pulmonary edema noted.  No specific findings of
pleural effusion or significant airspace opacity.
IMPRESSION: 1.  No acute radiographic findings.

## 2011-11-04 ENCOUNTER — Encounter: Payer: Self-pay | Admitting: *Deleted

## 2011-11-04 ENCOUNTER — Other Ambulatory Visit: Payer: BC Managed Care – PPO | Admitting: *Deleted

## 2011-12-09 ENCOUNTER — Ambulatory Visit (INDEPENDENT_AMBULATORY_CARE_PROVIDER_SITE_OTHER): Payer: Managed Care, Other (non HMO) | Admitting: Physician Assistant

## 2011-12-09 ENCOUNTER — Other Ambulatory Visit (INDEPENDENT_AMBULATORY_CARE_PROVIDER_SITE_OTHER): Payer: Managed Care, Other (non HMO) | Admitting: *Deleted

## 2011-12-09 DIAGNOSIS — I119 Hypertensive heart disease without heart failure: Secondary | ICD-10-CM

## 2011-12-09 DIAGNOSIS — E7800 Pure hypercholesterolemia, unspecified: Secondary | ICD-10-CM

## 2011-12-09 DIAGNOSIS — I2510 Atherosclerotic heart disease of native coronary artery without angina pectoris: Secondary | ICD-10-CM

## 2011-12-09 LAB — HEPATIC FUNCTION PANEL
AST: 27 U/L (ref 0–37)
Bilirubin, Direct: 0 mg/dL (ref 0.0–0.3)
Total Bilirubin: 0.4 mg/dL (ref 0.3–1.2)

## 2011-12-09 LAB — BASIC METABOLIC PANEL
BUN: 15 mg/dL (ref 6–23)
CO2: 22 mEq/L (ref 19–32)
Calcium: 9.1 mg/dL (ref 8.4–10.5)
Glucose, Bld: 90 mg/dL (ref 70–99)
Sodium: 141 mEq/L (ref 135–145)

## 2011-12-09 LAB — LIPID PANEL
HDL: 45.6 mg/dL (ref >39.00)
Total CHOL/HDL Ratio: 3

## 2011-12-09 NOTE — Progress Notes (Signed)
   Exercise Treadmill Test  Pre-Exercise Testing Evaluation Rhythm: normal sinus  Rate: 65   PR:  .16 QRS:  .09  QT:  42 QTc: 44     Test  Exercise Tolerance Test Ordering MD: Tonny Bollman, MD  Interpreting MD:  Tereso Newcomer PA-C  Unique Test No: 1  Treadmill:  1  Indication for ETT: known ASHD  Contraindication to ETT: No   Stress Modality: exercise - treadmill  Cardiac Imaging Performed: non   Protocol: standard Bruce - maximal  Max BP: 181/87  Max MPHR (bpm):  167 85% MPR (bpm):  141  MPHR obtained (bpm):  123 % MPHR obtained:  74%  Reached 85% MPHR (min:sec):  N/A Total Exercise Time (min-sec):  9:20  Workload in METS:  10.6 Borg Scale: 17  Reason ETT Terminated:  patient's desire to stop    ST Segment Analysis At Rest: non-specific ST segment slurring With Exercise: non-specific ST changes  Other Information Arrhythmia:  No Angina during ETT:  absent (0) Quality of ETT:  non-diagnostic  ETT Interpretation:  normal - no evidence of ischemia by ST analysis  Comments: Good exercise tolerance. No chest pain. Normal BP response to exercise. No ST-T changes to suggest ischemia at submaximal exercise. Patient did take beta blocker today and this likely impacted heart rate.   Recommendations: Will arrange low level exercise Lexiscan Myoview Follow up with Dr. Tonny Bollman as directed. Tereso Newcomer, PA-C  10:11 AM 12/09/2011

## 2011-12-14 ENCOUNTER — Telehealth: Payer: Self-pay

## 2011-12-14 NOTE — Telephone Encounter (Signed)
Left message for pt to call back about the results of labs. Also the pt's insurance denied coverage for Myoview.  Per Dr Excell Seltzer this test can be cancelled and no other testing needs to be scheduled at this time. Myoview cancelled in system and order removed.   Carmalita Wakefield,  Can you rescind nuc order and canx test for me? ----- Message ----- From: Micheline Chapman, MD Sent: 12/14/2011 1:26 PM To: Sharyn Blitz, RN, Britt Bolognese Subject: RE: Peter Skinner   I reviewed the patient's chart. He is asymptomatic. I feel comfortable with his exercise treadmill study result even though he did not reach target heart rate. ----- Message ----- From: Britt Bolognese Sent: 12/14/2011 10:09 AM To: Sharyn Blitz, RN, Micheline Chapman, MD Subject: Denied Myoview   Dr. Excell Seltzer,  [facility], precert company for pt's ins. Rosann Auerbach, has denied nuc test d/t not medically necessary. They recommend stress echo or exercise ECG.  Please advise.  Thanks,  Charmaine

## 2011-12-16 NOTE — Telephone Encounter (Signed)
Pt aware of lab results and that Celine Ahr has been cancelled.

## 2011-12-19 ENCOUNTER — Other Ambulatory Visit (HOSPITAL_COMMUNITY): Payer: Managed Care, Other (non HMO)

## 2012-03-02 ENCOUNTER — Telehealth: Payer: Self-pay | Admitting: Cardiovascular Disease

## 2012-03-02 NOTE — Telephone Encounter (Signed)
Please return call to patient Re:  CIALIS  Patient recvd RX for Cialis at last visit w/cooper.  RX not in med list, patient would like a refill.  Please return call to patient at [phone] to advise if this request is possible. [facility]ill should be [facility] to [facility] [place] already on file.

## 2012-03-02 NOTE — Telephone Encounter (Signed)
I spoke with the pt and made him aware that Cialis is not on his medication list. The pt said that Dr Excell Seltzer wrote him a Rx at his last office visit.  I made the pt aware that I would have to get an order from Dr Excell Seltzer before giving the pt a Rx. [facility] Excell Seltzer had given the pt a Rx and they do not have any record of Dr Excell Seltzer prescribing Cialis in the past. The pharmacist said that Dr Warrick Parisian is the provider listed on Cialis Rx. I called the pt back and left him a message that he should request refills from Dr Noberto Retort office.   If the pt needs Dr Excell Seltzer to start prescribing this medication then he should contact our office again for further discussion.

## 2012-06-25 ENCOUNTER — Other Ambulatory Visit: Payer: Self-pay | Admitting: Cardiovascular Disease

## 2012-07-21 ENCOUNTER — Other Ambulatory Visit: Payer: Self-pay | Admitting: Cardiovascular Disease

## 2012-07-23 ENCOUNTER — Telehealth: Payer: Self-pay | Admitting: *Deleted

## 2012-07-23 NOTE — Telephone Encounter (Signed)
l/m to call Lb to find out dosage of amlodipine to refill

## 2012-08-17 ENCOUNTER — Other Ambulatory Visit: Payer: Self-pay | Admitting: Cardiovascular Disease

## 2012-09-17 ENCOUNTER — Other Ambulatory Visit: Payer: Self-pay | Admitting: Cardiovascular Disease

## 2012-09-22 ENCOUNTER — Other Ambulatory Visit: Payer: Self-pay | Admitting: Cardiovascular Disease

## 2012-11-06 ENCOUNTER — Other Ambulatory Visit: Payer: Self-pay | Admitting: Cardiovascular Disease

## 2012-11-10 ENCOUNTER — Other Ambulatory Visit: Payer: Self-pay | Admitting: Cardiovascular Disease

## 2012-11-11 ENCOUNTER — Other Ambulatory Visit: Payer: Self-pay | Admitting: Cardiovascular Disease

## 2012-12-15 ENCOUNTER — Other Ambulatory Visit: Payer: Self-pay | Admitting: Cardiovascular Disease

## 2013-01-04 ENCOUNTER — Other Ambulatory Visit: Payer: Self-pay | Admitting: Cardiovascular Disease

## 2013-02-23 ENCOUNTER — Other Ambulatory Visit: Payer: Self-pay | Admitting: Cardiovascular Disease

## 2013-02-27 ENCOUNTER — Other Ambulatory Visit: Payer: Self-pay | Admitting: Cardiovascular Disease

## 2013-03-14 ENCOUNTER — Other Ambulatory Visit: Payer: Self-pay | Admitting: Cardiovascular Disease

## 2013-04-11 ENCOUNTER — Other Ambulatory Visit: Payer: Self-pay | Admitting: Cardiovascular Disease

## 2013-04-19 ENCOUNTER — Other Ambulatory Visit: Payer: Self-pay

## 2013-04-19 MED ORDER — AMLODIPINE BESYLATE 10 MG PO TABS: ORAL_TABLET | ORAL | Status: DC

## 2013-04-24 ENCOUNTER — Other Ambulatory Visit: Payer: Self-pay | Admitting: Cardiovascular Disease

## 2013-05-11 ENCOUNTER — Other Ambulatory Visit: Payer: Self-pay | Admitting: Cardiovascular Disease

## 2013-05-14 ENCOUNTER — Other Ambulatory Visit: Payer: Self-pay | Admitting: *Deleted

## 2013-05-14 MED ORDER — AMLODIPINE BESYLATE 10 MG PO TABS: ORAL_TABLET | ORAL | Status: DC

## 2013-05-14 NOTE — Telephone Encounter (Signed)
Patient requested for refill on amlodipine and Crestor. i called patient to make him aware he was long since by Dr. Excell Seltzer (in 2012) and he has to make an appt to see Dr. Excell Seltzer ASAP for frther refill. Patient agreed to call office and schedule appt . i sent 30days supply with no refills for both Amlodipine and Crestor. Thanks [place]

## 2013-05-24 ENCOUNTER — Other Ambulatory Visit: Payer: Self-pay | Admitting: Cardiovascular Disease

## 2013-05-24 NOTE — Telephone Encounter (Signed)
Pt got a 15 count refill and was told they needed an appointment, no appt made. Refill?

## 2013-06-14 ENCOUNTER — Other Ambulatory Visit: Payer: Self-pay | Admitting: Cardiovascular Disease

## 2013-07-10 ENCOUNTER — Other Ambulatory Visit: Payer: Self-pay | Admitting: Cardiovascular Disease

## 2013-07-12 ENCOUNTER — Other Ambulatory Visit: Payer: Self-pay | Admitting: Cardiovascular Disease

## 2013-07-21 ENCOUNTER — Other Ambulatory Visit: Payer: Self-pay | Admitting: Cardiovascular Disease

## 2013-07-25 ENCOUNTER — Telehealth: Payer: Self-pay | Admitting: Cardiovascular Disease

## 2013-07-25 MED ORDER — LISINOPRIL 20 MG PO TABS: 20.0000 mg | ORAL_TABLET | Freq: Every day | ORAL | Status: DC

## 2013-07-25 MED ORDER — PRASUGREL HCL 10 MG PO TABS: ORAL_TABLET | ORAL | Status: DC

## 2013-07-25 MED ORDER — CARVEDILOL 25 MG PO TABS: ORAL_TABLET | ORAL | Status: DC

## 2013-07-25 MED ORDER — AMLODIPINE BESYLATE 10 MG PO TABS: ORAL_TABLET | ORAL | Status: DC

## 2013-07-25 MED ORDER — ROSUVASTATIN CALCIUM 40 MG PO TABS: ORAL_TABLET | ORAL | Status: DC

## 2013-07-25 NOTE — Telephone Encounter (Signed)
I spoke with the pt and scheduled him to see Dr Excell Seltzer on 08/30/13.  I did send in refills on the pt's medications that will last until his appointment.

## 2013-07-25 NOTE — Telephone Encounter (Signed)
New problem   Pt stated someone called him about an appt. Please call pt.

## 2013-08-10 ENCOUNTER — Encounter: Payer: Self-pay | Admitting: Cardiovascular Disease

## 2013-08-30 ENCOUNTER — Encounter: Payer: Self-pay | Admitting: Cardiovascular Disease

## 2013-08-30 ENCOUNTER — Ambulatory Visit (INDEPENDENT_AMBULATORY_CARE_PROVIDER_SITE_OTHER): Payer: Managed Care, Other (non HMO) | Admitting: Cardiovascular Disease

## 2013-08-30 VITALS — BP 154/90 | HR 51 | Ht 69.0 in | Wt 219.0 lb

## 2013-08-30 DIAGNOSIS — I10 Essential (primary) hypertension: Secondary | ICD-10-CM

## 2013-08-30 DIAGNOSIS — I2510 Atherosclerotic heart disease of native coronary artery without angina pectoris: Secondary | ICD-10-CM

## 2013-08-30 DIAGNOSIS — E785 Hyperlipidemia, unspecified: Secondary | ICD-10-CM

## 2013-08-30 MED ORDER — LISINOPRIL 20 MG PO TABS: 20.0000 mg | ORAL_TABLET | Freq: Every day | ORAL | Status: DC

## 2013-08-30 MED ORDER — ROSUVASTATIN CALCIUM 40 MG PO TABS: ORAL_TABLET | ORAL | Status: DC

## 2013-08-30 MED ORDER — AMLODIPINE BESYLATE 10 MG PO TABS: ORAL_TABLET | ORAL | Status: DC

## 2013-08-30 MED ORDER — CARVEDILOL 25 MG PO TABS: ORAL_TABLET | ORAL | Status: DC

## 2013-08-30 NOTE — Patient Instructions (Signed)
Your physician has recommended you make the following change in your medication: STOP Effient  Your physician wants you to follow-up in: 1 YEAR with Dr Excell Seltzer.  You will receive a reminder letter in the mail two months in advance. If you don't receive a letter, please call our office to schedule the follow-up appointment.

## 2013-08-30 NOTE — Progress Notes (Signed)
    HPI:  This is a 57 year old gentleman returning for followup evaluation. The patient has coronary artery disease and presented with an anterior MI in 2012. He was treated with primary PCI using a drug-eluting stent to the LAD. The patient underwent staged intervention of the right coronary artery to treat high-grade disease in that vessel. He's had normal left ventricular systolic function.  The patient is doing well. He works long hours. He does not engage in regular exercise. He denies chest pain, chest pressure, shortness of breath, edema, or palpitations. He ran out of blood pressure medicines a few days ago. He notes that blood pressures have been well controlled when he is taking his medicine.  Outpatient Encounter Prescriptions as of 08/30/2013  Medication Sig  . amLODipine (NORVASC) 10 MG tablet TAKE 1 TABLET BY MOUTH EVERY DAY  . aspirin 81 MG EC tablet Take 81 mg by mouth daily.    . carvedilol (COREG) 25 MG tablet TAKE 1 TABLET BY MOUTH TWICE A DAY  . lisinopril (PRINIVIL,ZESTRIL) 20 MG tablet Take 1 tablet (20 mg total) by mouth daily.  . rosuvastatin (CRESTOR) 40 MG tablet TAKE 1 TABLET (40 MG TOTAL) BY MOUTH DAILY.  . [DISCONTINUED] amLODipine (NORVASC) 10 MG tablet TAKE 1 TABLET BY MOUTH EVERY DAY  . [DISCONTINUED] carvedilol (COREG) 25 MG tablet TAKE 1 TABLET BY MOUTH TWICE A DAY  . [DISCONTINUED] lisinopril (PRINIVIL,ZESTRIL) 20 MG tablet Take 1 tablet (20 mg total) by mouth daily.  . [DISCONTINUED] prasugrel (EFFIENT) 10 MG TABS tablet TAKE 1 TABLET BY MOUTH EVERY DAY  . [DISCONTINUED] rosuvastatin (CRESTOR) 40 MG tablet TAKE 1 TABLET (40 MG TOTAL) BY MOUTH DAILY.    No Known Allergies  Past Medical History  Diagnosis Date  . CAD (coronary artery disease)     ant STEMI 11/06/10 tx with DES to LAD. dLM 20-30%; mCFX 30-40%; EF 60%. s/p DES to RCA 11/08/10.   Marland Kitchen HLD (hyperlipidemia)   . HTN (hypertension)   . Glucose intolerance (impaired glucose tolerance)     A1C 5.9  1/12    ROS: Negative except as per HPI  BP 154/90  Pulse 51  Ht 5\' 9"  (1.753 m)  Wt 219 lb (99.338 kg)  BMI 32.33 kg/m2  PHYSICAL EXAM: Pt is alert and oriented, NAD HEENT: normal Neck: JVP - normal, carotids 2+= without bruits Lungs: CTA bilaterally CV: RRR without murmur or gallop Abd: soft, NT, Positive BS, no hepatomegaly Ext: no C/C/E, distal pulses intact and equal Skin: warm/dry no rash  EKG:  Sinus bradycardia 51 beats per minute, cannot rule out anterior infarct age undetermined. Otherwise within normal limits.  ASSESSMENT AND PLAN: 1. Coronary artery disease, native vessel. The patient is stable without anginal symptoms. He will continue on his current medical program with the exception of effient. He has a few years out from PCI can stop effient at this point. He needs to remain on aspirin lifelong.  2. Hypertension. Blood pressure elevated today, but he has been out of his medicines for a few days. Prescriptions were written for amlodipine, carvedilol, and lisinopril, which she will resume.  3. Hyperlipidemia. The patient takes Crestor 40 mg daily. His lipids were reviewed and they are at goal.  For followup I will see him back in one year.  Tonny Bollman 08/30/2013 3:55 PM

## 2014-08-25 ENCOUNTER — Encounter: Payer: Self-pay | Admitting: Physician Assistant

## 2014-09-06 ENCOUNTER — Other Ambulatory Visit: Payer: Self-pay | Admitting: Cardiovascular Disease

## 2014-09-17 ENCOUNTER — Other Ambulatory Visit: Payer: Self-pay | Admitting: Cardiovascular Disease

## 2014-09-19 ENCOUNTER — Other Ambulatory Visit: Payer: Self-pay | Admitting: Cardiovascular Disease

## 2014-10-02 ENCOUNTER — Other Ambulatory Visit: Payer: Self-pay | Admitting: Cardiovascular Disease

## 2014-10-10 ENCOUNTER — Ambulatory Visit (INDEPENDENT_AMBULATORY_CARE_PROVIDER_SITE_OTHER): Payer: Managed Care, Other (non HMO) | Admitting: Nurse Practitioner

## 2014-10-10 ENCOUNTER — Encounter: Payer: Self-pay | Admitting: Nurse Practitioner

## 2014-10-10 VITALS — BP 156/84 | HR 55 | Resp 14 | Ht 69.0 in | Wt 218.4 lb

## 2014-10-10 DIAGNOSIS — E785 Hyperlipidemia, unspecified: Secondary | ICD-10-CM

## 2014-10-10 DIAGNOSIS — I10 Essential (primary) hypertension: Secondary | ICD-10-CM

## 2014-10-10 DIAGNOSIS — I259 Chronic ischemic heart disease, unspecified: Secondary | ICD-10-CM

## 2014-10-10 MED ORDER — SILDENAFIL CITRATE 100 MG PO TABS: 100.0000 mg | ORAL_TABLET | Freq: Every day | ORAL | Status: DC | PRN

## 2014-10-10 NOTE — Patient Instructions (Addendum)
Stay on your current medicines  I have given you a RX for Viagra   Your BP today is 140/80   Work on your smoking!!!!  See Dr. Excell Seltzerooper in one year  Call the [facility] office at [phone] if you have any questions, problems or concerns.

## 2014-10-10 NOTE — Progress Notes (Signed)
Leotis PainEric R Olivares Date of Birth: 07/08/1956 Medical Record #[id number]  History of Present Illness: Mr. Rainey PinesStockton is seen back today for a one year check. Seen for Dr. Excell Seltzerooper. He is a 58 year old male with known CAD with prior anterior MI in 2012 treated with primary PCI with DES to the LAD. Then staged intervention to the RCA. He has had normal LV function. Other issues include HTN and HLD. Remote smoker.   Last seen over a year ago. Effient was stopped at that time. Labs are checked by PCP.   Comes back today. Here alone. Doing ok. No chest pain. Not short of breath. Feels ok on his medicines but has a hard time taking the Coreg BID due to his work schedule - works all different shifts. This is probably why BP is not at goal - he notes it is better when he takes the Coreg twice a day. Labs by PCP. No real issue and he is happy with how he is doing. Smoking some - "not as much as he has". Wanting Cialis or Viagra for his ED. Has had lab back in October which are reviewed.   Current Outpatient Prescriptions  Medication Sig Dispense Refill  . amLODipine (NORVASC) 10 MG tablet TAKE 1 TABLET BY MOUTH EVERY DAY 30 tablet 2  . aspirin 81 MG EC tablet Take 81 mg by mouth daily.      . carvedilol (COREG) 25 MG tablet TAKE 1 TABLET BY MOUTH TWICE A DAY 60 tablet 6  . lisinopril (PRINIVIL,ZESTRIL) 20 MG tablet TAKE 1 TABLET (20 MG TOTAL) BY MOUTH DAILY. 30 tablet 0  . rosuvastatin (CRESTOR) 40 MG tablet TAKE 1 TABLET (40 MG TOTAL) BY MOUTH DAILY. 30 tablet 11   No current facility-administered medications for this visit.    No Known Allergies  Past Medical History  Diagnosis Date  . CAD (coronary artery disease)     ant STEMI 11/06/10 tx with DES to LAD. dLM 20-30%; mCFX 30-40%; EF 60%. s/p DES to RCA 11/08/10.   Marland Kitchen. HLD (hyperlipidemia)   . HTN (hypertension)   . Glucose intolerance (impaired glucose tolerance)     A1C 5.9 1/12    History reviewed. No pertinent past surgical history.  History    Smoking status  . Former Smoker  Smokeless tobacco  . Not on file    Comment: quit after MI in 1/12    History  Alcohol Use  . Yes    Comment: on weekeds; not a daily drinker.     Family History  Problem Relation Age of Onset  . Coronary artery disease Neg Hx     premature CAD    Review of Systems: The review of systems is per the HPI.  All other systems were reviewed and are negative.  Physical Exam: BP 156/84 mmHg  Pulse 55  Resp 14  Ht 5\' 9"  (1.753 m)  Wt 218 lb 6.4 oz (99.066 kg)  BMI 32.24 kg/m2  BP by me is 140/80.  Patient is very pleasant and in no acute distress. Smells of tobacco. Skin is warm and dry. Color is normal.  HEENT is unremarkable. Normocephalic/atraumatic. PERRL. Sclera are nonicteric. Neck is supple. No masses. No JVD. Lungs are clear. Cardiac exam shows a regular rate and rhythm. Abdomen is soft. Extremities are without edema. Gait and ROM are intact. No gross neurologic deficits noted.  Wt Readings from Last 3 Encounters:  10/10/14 218 lb 6.4 oz (99.066 kg)  08/30/13 219 lb (  99.338 kg)  06/06/11 218 lb (98.884 kg)    LABORATORY DATA/PROCEDURES:  EKG today shows NSR. Old anterior MI noted.   Lab Results  Component Value Date   WBC 8.0 11/09/2010   HGB 14.5 11/09/2010   HCT 44.5 11/09/2010   PLT 204 11/09/2010   GLUCOSE 90 12/09/2011   CHOL 114 12/09/2011   TRIG 121.0 12/09/2011   HDL 45.60 12/09/2011   LDLCALC 44 12/09/2011   ALT 23 12/09/2011   AST 27 12/09/2011   NA 141 12/09/2011   K 3.7 12/09/2011   CL 108 12/09/2011   CREATININE 1.2 12/09/2011   BUN 15 12/09/2011   CO2 22 12/09/2011   TSH 0.402 11/06/2010   INR 1.13 11/08/2010   HGBA1C * 11/06/2010    5.9 (NOTE)                                                                       According to the ADA Clinical Practice Recommendations for 2011, when HbA1c is used as a screening test:   >=6.5%   Diagnostic of Diabetes Mellitus           (if abnormal result  is confirmed)   5.7-6.4%   Increased risk of developing Diabetes Mellitus  References:Diagnosis and Classification of Diabetes Mellitus,Diabetes Care,2011,34(Suppl 1):S62-S69 and Standards of Medical Care in         Diabetes - 2011,Diabetes Care,2011,34  (Suppl 1):S11-S61.    BNP (last 3 results) No results for input(s): PROBNP in the last 8760 hours.   Assessment / Plan: 1. CAD - prior MI/PCI - stable clinically without symptoms. Continue with current regimen. See back in one year. Encouraged to stop smoking.   2. HTN -  BP fair - needs to take his Coreg BID  3. HLD -  On statin - labs checked by PCP  4. ED - will try Viagra. Coupon card given  See back in a year. Continue with CV risk factor modification, especially smoking cessation.   Patient is agreeable to this plan and will call if any problems develop in the interim.   Rosalio MacadamiaLori C. Benjamen Koelling, RN, ANP-C [facility] [address] [place], [place]  [id number] [phone]

## 2014-10-23 ENCOUNTER — Other Ambulatory Visit: Payer: Self-pay | Admitting: Cardiovascular Disease

## 2014-11-14 ENCOUNTER — Other Ambulatory Visit: Payer: Self-pay | Admitting: Nurse Practitioner

## 2014-12-12 ENCOUNTER — Other Ambulatory Visit: Payer: Self-pay | Admitting: Cardiovascular Disease

## 2015-04-25 ENCOUNTER — Other Ambulatory Visit: Payer: Self-pay | Admitting: Cardiovascular Disease

## 2015-05-22 ENCOUNTER — Other Ambulatory Visit: Payer: Self-pay | Admitting: Cardiovascular Disease

## 2015-05-28 ENCOUNTER — Other Ambulatory Visit: Payer: Self-pay | Admitting: Cardiovascular Disease

## 2015-06-23 ENCOUNTER — Telehealth: Payer: Self-pay

## 2015-06-23 MED ORDER — SILDENAFIL CITRATE 20 MG PO TABS: ORAL_TABLET | ORAL | Status: DC

## 2015-06-23 NOTE — Telephone Encounter (Signed)
Five  tablets of Sildenafil is equivalent to one  Viagra. We will change prescription at pharmacy.

## 2015-06-25 ENCOUNTER — Other Ambulatory Visit: Payer: Self-pay | Admitting: Cardiovascular Disease

## 2015-07-22 ENCOUNTER — Other Ambulatory Visit: Payer: Self-pay | Admitting: Cardiovascular Disease

## 2015-09-24 ENCOUNTER — Other Ambulatory Visit: Payer: Self-pay | Admitting: Cardiovascular Disease

## 2015-10-07 ENCOUNTER — Other Ambulatory Visit: Payer: Self-pay | Admitting: *Deleted

## 2015-10-07 MED ORDER — AMLODIPINE BESYLATE 10 MG PO TABS: 10.0000 mg | ORAL_TABLET | Freq: Every day | ORAL | Status: DC

## 2015-10-07 MED ORDER — LISINOPRIL 20 MG PO TABS: ORAL_TABLET | ORAL | Status: DC

## 2015-10-10 ENCOUNTER — Other Ambulatory Visit: Payer: Self-pay | Admitting: Cardiovascular Disease

## 2015-11-17 ENCOUNTER — Telehealth: Payer: Self-pay | Admitting: Cardiovascular Disease

## 2015-11-17 NOTE — Telephone Encounter (Signed)
New Message   Pt daughter is calling for a rn to tell her what her father can take to help him stop smoking he is a heavy smoker

## 2015-11-17 NOTE — Telephone Encounter (Signed)
I contacted Kaleen Odea and made her aware that I cannot speak with her in regards to pt.  The pt does not have a DPR on file and she is not listed in the pt's chart as an emergency contact. I advised her that the pt can contact our office to discuss his care further or complete a DPR to allow [place] to speak with her.

## 2015-11-20 ENCOUNTER — Other Ambulatory Visit: Payer: Self-pay | Admitting: Cardiovascular Disease

## 2015-11-20 ENCOUNTER — Encounter: Payer: Self-pay | Admitting: Cardiovascular Disease

## 2015-11-27 ENCOUNTER — Ambulatory Visit: Payer: Managed Care, Other (non HMO) | Admitting: Cardiovascular Disease

## 2015-12-17 NOTE — Progress Notes (Signed)
Cardiology Office Note:    Date:  12/17/2015   ID:  Peter Skinner, DOB [birth year], MRN [id number]  PCP:  Warrick Parisian, MD  Cardiologist:  Dr. Tonny Bollman   Electrophysiologist:  n/a  Chief Complaint  Patient presents with  . Coronary Artery Disease    Follow up    History of Present Illness:     Peter Skinner is a 60 y.o. male with a hx of     Past Medical History  Diagnosis Date  . CAD (coronary artery disease)     ant STEMI 11/06/10 tx with DES to LAD. dLM 20-30%; mCFX 30-40%; EF 60%. s/p DES to RCA 11/08/10.   Marland Kitchen HLD (hyperlipidemia)   . HTN (hypertension)   . Glucose intolerance (impaired glucose tolerance)     A1C 5.9 1/12    No past surgical history on file.  Current Medications: Outpatient Prescriptions Prior to Visit  Medication Sig Dispense Refill  . amLODipine (NORVASC) 10 MG tablet TAKE 1 TABLET (10 MG TOTAL) BY MOUTH DAILY. 30 tablet 0  . aspirin 81 MG EC tablet Take 81 mg by mouth daily.      . carvedilol (COREG) 25 MG tablet TAKE 1 TABLET BY MOUTH TWICE A DAY 60 tablet 3  . lisinopril (PRINIVIL,ZESTRIL) 20 MG tablet TAKE 1 TABLET (20 MG TOTAL) BY MOUTH DAILY. 30 tablet 0  . rosuvastatin (CRESTOR) 40 MG tablet TAKE 1 TABLET (40 MG TOTAL) BY MOUTH DAILY. 30 tablet 6  . sildenafil (REVATIO) 20 MG tablet TAKE FIVE TABLETS BY MOUTH AS NEEDED PRIOR TO SEXUAL ACTIVITY 60 tablet 1   No facility-administered medications prior to visit.     Allergies:   Review of patient's allergies indicates no known allergies.   Social History   Social History  . Marital Status: Married    Spouse Name: N/A  . Number of Children: N/A  . Years of Education: N/A   Social History Main Topics  . Smoking status: Light Tobacco Smoker  . Smokeless tobacco: Not on file     Comment: quit after MI in 1/12  . Alcohol Use: Yes     Comment: on weekeds; not a daily drinker.   . Drug Use: No  . Sexual Activity: Not on file   Other Topics Concern  . Not on file    Social History Narrative   Married, grown children.    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      Family History:  The patient's family history includes Heart Problems in his mother. There is no history of Coronary artery disease.   ROS:   Please see the history of present illness.    ROS All other systems reviewed and are negative.   Physical Exam:    VS:  There were no vitals taken for this visit.   GEN: Well nourished, well developed, in no acute distress HEENT: normal Neck: no JVD, no masses Cardiac: Normal S1/S2, RRR; no murmurs, rubs, or gallops, no edema;   carotid bruits,   Respiratory:  clear to auscultation bilaterally; no wheezing, rhonchi or rales GI: soft, nontender, nondistended, + BS MS: no deformity or atrophy Skin: warm and dry, no rash Neuro:  Bilateral strength equal, no focal deficits  Psych: Alert and oriented x 3, normal affect  Wt Readings from Last 3 Encounters:  10/10/14 218 lb 6.4 oz (99.066 kg)  08/30/13 219 lb (99.338 kg)  06/06/11 218 lb (98.884 kg)      Studies/Labs Reviewed:  EKG:  EKG is  ordered today.  The ekg ordered today demonstrates   Recent Labs: No results found for requested labs within last 365 days.   Recent Lipid Panel    Component Value Date/Time   CHOL 114 12/09/2011 1018   TRIG 121.0 12/09/2011 1018   HDL 45.60 12/09/2011 1018   CHOLHDL 3 12/09/2011 1018   VLDL 24.2 12/09/2011 1018   LDLCALC 44 12/09/2011 1018    Additional studies/ records that were reviewed today include:    ETT 2/13 ETT Interpretation: normal - no evidence of ischemia by ST analysis  LHC 1/12 Left ventriculography shows mild anteroapical hypokinesis. The LV ejection fraction is preserved at 60%.  CORONARY ANGIOGRAPHY: The right coronary artery has diffuse luminal irregularities. There is nonobstructive disease throughout the proximal and mid right coronary artery. There is a moderate-sized acute marginal branch  present. The distal right coronary artery just before the bifurcation of the PDA and posterior AV segment has a 90% eccentric stenosis present.  Left coronary artery: The left main is patent. There is mild distal left main stenosis of no more than 20-30%.  LAD: The LAD is of large caliber. There are diffuse irregularities. The proximal LAD at the first diagonal origin has an 80-90% stenosis. The mid-LAD has heavy thrombus with a large filling defect at the origin of the second diagonal. The filling defect extends into the second diagonal branch. The mid and distal LAD are patent without high-grade obstructive disease and the LAD wraps around the left ventricular apex. The diagonal branches of the LAD are both patent.  Left circumflex: The left circumflex has mild nonobstructive disease. The mid circumflex has a 30-40% stenosis. The first OM is patent without significant stenosis. The AV groove circumflex beyond the OM is also patent without significant stenosis.  INTERVENTIONAL NOTE: After a therapeutic ACT was achieved, a Cougar guidewire was advanced into the apical portion of the LAD. Aspiration thrombectomy was performed with an Export catheter. This seemed to reduce the thrombus burden over the second lesion. Both lesions were in close proximity at the origins of the first and second diagonals and I thought they could be covered with one stent, a 3.0 x 28-mm Promus Element stent was chosen and carefully positioned. The stent was deployed at 11 atmospheres and appeared well expanded. The stent was then postdilated to 14 atmospheres with a 3.25 x 20-mm Cameron Quantum apex. Of note, prior to aspiration thrombectomy, intravenous eptifibatide was started via a double bolus and drip protocol.  At the completion of the interventional procedure, the stenosis was reduced from 90% down to 0%. Flow was TIMI 3 both pre and post. There was residual thrombus in the second  diagonal branch, but it did not appear flow obstructive and there was TIMI 3 flow. The patient tolerated the entire procedure well and there were no immediate complications. A TR band was used for radial artery hemostasis.  FINAL ASSESSMENT: 1. 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 2. Severe distal right coronary artery stenosis. 3. Nonobstructive left circumflex stenosis. 4. Preserved left ventricular function. 5. Successful percutaneous intervention of the left anterior  descending artery using aspiration thrombectomy followed by  stenting with a drug-eluting stent platform.  RECOMMENDATIONS: The patient will continue with aspirin, Effient, and Integrilin. We will plan on staged PCI of the right coronary artery on Monday.  PCI 1/12  FINAL CONCLUSIONS: Successful percutaneous intervention of the right coronary artery using a drug-eluting stent platform.2.75 x  16 mm Promus DES   ASSESSMENT:     No diagnosis found.  PLAN:     In order of problems listed above:  1.    Medication Adjustments/Labs and Tests Ordered: Current medicines are reviewed at length with the patient today.  Concerns regarding medicines are outlined above.  Medication changes, Labs and Tests ordered today are outlined in the Patient Instructions noted below. There are no Patient Instructions on file for this visit. Signed, Tereso Newcomer, PA-C  12/17/2015 5:55 PM    [facility] [address] [place], [place], [place]  [address] Phone: [phone]; Fax: [phone]     This encounter was created in error - please disregard.

## 2015-12-18 ENCOUNTER — Encounter: Payer: Managed Care, Other (non HMO) | Admitting: Physician Assistant

## 2015-12-23 ENCOUNTER — Encounter: Payer: Self-pay | Admitting: Physician Assistant

## 2015-12-24 ENCOUNTER — Other Ambulatory Visit: Payer: Self-pay | Admitting: Cardiovascular Disease

## 2015-12-29 ENCOUNTER — Other Ambulatory Visit: Payer: Self-pay

## 2015-12-29 MED ORDER — AMLODIPINE BESYLATE 10 MG PO TABS
ORAL_TABLET | ORAL | Status: DC
Start: 2015-12-29 — End: 2016-03-14

## 2015-12-29 MED ORDER — LISINOPRIL 20 MG PO TABS: ORAL_TABLET | ORAL | Status: DC

## 2015-12-29 MED ORDER — CARVEDILOL 25 MG PO TABS: 25.0000 mg | ORAL_TABLET | Freq: Two times a day (BID) | ORAL | Status: DC

## 2016-02-04 ENCOUNTER — Other Ambulatory Visit: Payer: Self-pay | Admitting: Cardiovascular Disease

## 2016-02-04 NOTE — Telephone Encounter (Signed)
The pt has not been seen by our office since 10/10/2014.  Please deny Rx.  The pt will have to be seen for appointment before this prescription can be refilled.

## 2016-02-19 ENCOUNTER — Other Ambulatory Visit: Payer: Self-pay | Admitting: *Deleted

## 2016-03-04 ENCOUNTER — Other Ambulatory Visit: Payer: Self-pay | Admitting: Cardiovascular Disease

## 2016-03-07 ENCOUNTER — Telehealth: Payer: Self-pay | Admitting: Cardiovascular Disease

## 2016-03-07 NOTE — Telephone Encounter (Addendum)
°*  STAT* If patient is at the pharmacy, call can be transferred to refill team.   1. Which medications need to be refilled? (please list name of each medication and dose if known) Sildenafil   2. Which pharmacy/location (including street and city if local pharmacy) is medication to be sent to?[facility] [phone])  3. Do they need a 30 day or 90 day supply? 30

## 2016-03-07 NOTE — Telephone Encounter (Signed)
Pt was last seen by our office 10/10/2014.  The pt has a pending appointment on 03/14/2016.  I left the pt a voicemail that our office cannot authorize refills on this medication until he is seen for follow-up appointment.

## 2016-03-14 ENCOUNTER — Ambulatory Visit (INDEPENDENT_AMBULATORY_CARE_PROVIDER_SITE_OTHER): Payer: 59 | Admitting: Nurse Practitioner

## 2016-03-14 ENCOUNTER — Encounter: Payer: Self-pay | Admitting: Nurse Practitioner

## 2016-03-14 VITALS — BP 160/100 | HR 57 | Ht 69.0 in | Wt 211.4 lb

## 2016-03-14 DIAGNOSIS — I10 Essential (primary) hypertension: Secondary | ICD-10-CM | POA: Diagnosis not present

## 2016-03-14 DIAGNOSIS — I259 Chronic ischemic heart disease, unspecified: Secondary | ICD-10-CM

## 2016-03-14 DIAGNOSIS — E785 Hyperlipidemia, unspecified: Secondary | ICD-10-CM

## 2016-03-14 LAB — HEPATIC FUNCTION PANEL
ALT: 12 U/L (ref 9–46)
AST: 18 U/L (ref 10–35)
Albumin: 4.1 g/dL (ref 3.6–5.1)
Alkaline Phosphatase: 63 U/L (ref 40–115)
Bilirubin, Direct: 0.1 mg/dL (ref <=0.2)
Indirect Bilirubin: 0.4 mg/dL (ref 0.2–1.2)
Total Bilirubin: 0.5 mg/dL (ref 0.2–1.2)
Total Protein: 6.7 g/dL (ref 6.1–8.1)

## 2016-03-14 LAB — BASIC METABOLIC PANEL
BUN: 14 mg/dL (ref 7–25)
CO2: 26 mmol/L (ref 20–31)
Calcium: 9.1 mg/dL (ref 8.6–10.3)
Chloride: 108 mmol/L (ref 98–110)
Creat: 1.18 mg/dL (ref 0.70–1.33)
Glucose, Bld: 103 mg/dL — ABNORMAL HIGH (ref 65–99)
Potassium: 3.7 mmol/L (ref 3.5–5.3)
Sodium: 141 mmol/L (ref 135–146)

## 2016-03-14 LAB — LIPID PANEL
Cholesterol: 116 mg/dL — ABNORMAL LOW (ref 125–200)
HDL: 36 mg/dL — ABNORMAL LOW (ref >=40)
LDL Cholesterol: 65 mg/dL (ref <130)
Total CHOL/HDL Ratio: 3.2 Ratio (ref <=5.0)
Triglycerides: 74 mg/dL (ref <150)
VLDL: 15 mg/dL (ref <30)

## 2016-03-14 MED ORDER — ROSUVASTATIN CALCIUM 40 MG PO TABS: ORAL_TABLET | ORAL | Status: DC

## 2016-03-14 MED ORDER — CARVEDILOL 25 MG PO TABS: 25.0000 mg | ORAL_TABLET | Freq: Two times a day (BID) | ORAL | Status: DC

## 2016-03-14 MED ORDER — LISINOPRIL 20 MG PO TABS: ORAL_TABLET | ORAL | Status: DC

## 2016-03-14 MED ORDER — SILDENAFIL CITRATE 20 MG PO TABS: ORAL_TABLET | ORAL | Status: DC

## 2016-03-14 MED ORDER — AMLODIPINE BESYLATE 10 MG PO TABS: ORAL_TABLET | ORAL | Status: DC

## 2016-03-14 NOTE — Patient Instructions (Addendum)
We will be checking the following labs today - BMET, lipids and HPF   Medication Instructions:    Continue with your current medicines.   I sent in your refills today.     Testing/Procedures To Be Arranged:  N/A  Follow-Up:   See Dr. Excell Seltzerooper in one year.     Other Special Instructions:   N/A    If you need a refill on your cardiac medications before your next appointment, please call your pharmacy.   Call the [facility] office at [phone] if you have any questions, problems or concerns.

## 2016-03-14 NOTE — Progress Notes (Signed)
CARDIOLOGY OFFICE NOTE  Date:  03/14/2016    Peter Skinner Date of Birth: 05/12/1956 Medical Record #[id number]  PCP:  Warrick ParisianSTALLINGS,SHEILA, MD  Cardiologist:  Excell Seltzerooper    Chief Complaint  Patient presents with  . Coronary Artery Disease    2 year visit - seen for Dr. Excell Seltzerooper    History of Present Illness: Peter Painric R Terhaar is a [age] y.o. male who presents today for a follow up visit - this is an approximate 2 year check. Seen for Dr. Excell Seltzerooper.   He has known CAD with prior anterior MI in 2012 treated with primary PCI with DES to the LAD. Then staged intervention to the RCA. He has had normal LV function. Other issues include HTN and HLD. Remote smoker.   Last seen in December of 2015. BP not at goal but not taking Coreg BID due to his work schedule.   Comes back today. Here alone. No medicines taken today yet. BP is up. Says he has not ran out of medicines. No recent labs. No chest pain. Not short of breath. Feels pretty good and happy with how he is doing.    Past Medical History  Diagnosis Date  . CAD (coronary artery disease)     ant STEMI 11/06/10 tx with DES to LAD. dLM 20-30%; mCFX 30-40%; EF 60%. s/p DES to RCA 11/08/10.   Marland Kitchen. HLD (hyperlipidemia)   . HTN (hypertension)   . Glucose intolerance (impaired glucose tolerance)     A1C 5.9 1/12    No past surgical history on file.   Medications: Current Outpatient Prescriptions  Medication Sig Dispense Refill  . amLODipine (NORVASC) 10 MG tablet TAKE 1 TABLET (10 MG TOTAL) BY MOUTH DAILY. 90 tablet 3  . aspirin 81 MG EC tablet Take 81 mg by mouth daily.      . carvedilol (COREG) 25 MG tablet Take 1 tablet (25 mg total) by mouth 2 (two) times daily. 90 tablet 0  . lisinopril (PRINIVIL,ZESTRIL) 20 MG tablet TAKE 1 TABLET (20 MG TOTAL) BY MOUTH DAILY. 90 tablet 3  . rosuvastatin (CRESTOR) 40 MG tablet TAKE 1 TABLET (40 MG TOTAL) BY MOUTH DAILY. 90 tablet 3  . sildenafil (REVATIO) 20 MG tablet TAKE FIVE TABLETS BY MOUTH AS NEEDED  PRIOR TO SEXUAL ACTIVITY 60 tablet 3   No current facility-administered medications for this visit.    Allergies: No Known Allergies  Social History: The patient  reports that he has been smoking.  He does not have any smokeless tobacco history on file. He reports that he drinks alcohol. He reports that he does not use illicit drugs.   Family History: The patient's family history includes Heart Problems in his mother. There is no history of Coronary artery disease.   Review of Systems: Please see the history of present illness.   Otherwise, the review of systems is positive for none.   All other systems are reviewed and negative.   Physical Exam: VS:  BP 160/100 mmHg  Pulse 57  Ht 5\' 9"  (1.753 m)  Wt 211 lb 6.4 oz (95.89 kg)  BMI 31.20 kg/m2 .  BMI Body mass index is 31.2 kg/(m^2).  Wt Readings from Last 3 Encounters:  03/14/16 211 lb 6.4 oz (95.89 kg)  10/10/14 218 lb 6.4 oz (99.066 kg)  08/30/13 219 lb (99.338 kg)   Recheck by me is 130/80  General: Pleasant. Well developed, well nourished and in no acute distress.  HEENT: Normal. Neck: Supple, no  JVD, carotid bruits, or masses noted.  Cardiac: Regular rate and rhythm. No murmurs, rubs, or gallops. No edema.  Respiratory:  Lungs are clear to auscultation bilaterally with normal work of breathing.  GI: Soft and nontender.  MS: No deformity or atrophy. Gait and ROM intact. Skin: Warm and dry. Color is normal.  Neuro:  Strength and sensation are intact and no gross focal deficits noted.  Psych: Alert, appropriate and with normal affect.   LABORATORY DATA:  EKG:  EKG is ordered today. This demonstrates NSR with anterior Q waves - unchanged.  Lab Results  Component Value Date   WBC 8.0 11/09/2010   HGB 14.5 11/09/2010   HCT 44.5 11/09/2010   PLT 204 11/09/2010   GLUCOSE 90 12/09/2011   CHOL 114 12/09/2011   TRIG 121.0 12/09/2011   HDL 45.60 12/09/2011   LDLCALC 44 12/09/2011   ALT 23 12/09/2011   AST 27  12/09/2011   NA 141 12/09/2011   K 3.7 12/09/2011   CL 108 12/09/2011   CREATININE 1.2 12/09/2011   BUN 15 12/09/2011   CO2 22 12/09/2011   TSH 0.402 11/06/2010   INR 1.13 11/08/2010   HGBA1C * 11/06/2010    5.9 (NOTE)                                                                       According to the ADA Clinical Practice Recommendations for 2011, when HbA1c is used as a screening test:   >=6.5%   Diagnostic of Diabetes Mellitus           (if abnormal result  is confirmed)  5.7-6.4%   Increased risk of developing Diabetes Mellitus  References:Diagnosis and Classification of Diabetes Mellitus,Diabetes Care,2011,34(Suppl 1):S62-S69 and Standards of Medical Care in         Diabetes - 2011,Diabetes Care,2011,34  (Suppl 1):S11-S61.    BNP (last 3 results) No results for input(s): BNP in the last 8760 hours.  ProBNP (last 3 results) No results for input(s): PROBNP in the last 8760 hours.   Other Studies Reviewed Today:   Assessment/Plan: 1. CAD - prior MI/PCI - stable clinically without symptoms. Continue with current regimen. See back in one year.   2. HTN - BP recheck by me today is ok - meds refilled.   3. HLD - On statin - labs checked today - he is fasting  4. ED - using Revatio due to cost savings.  Current medicines are reviewed with the patient today.  The patient does not have concerns regarding medicines other than what has been noted above.  The following changes have been made:  See above.  Labs/ tests ordered today include:    Orders Placed This Encounter  Procedures  . Basic metabolic panel  . Hepatic function panel  . Lipid panel  . EKG 12-Lead     Disposition:   FU with Dr. Excell Seltzer in one year.  Patient is agreeable to this plan and will call if any problems develop in the interim.   Signed: Rosalio Macadamia, RN, ANP-C 03/14/2016 10:49 AM  [facility] [address] [place], [place]  [address] Phone:  [phone] Fax: [phone]

## 2016-06-24 ENCOUNTER — Other Ambulatory Visit: Payer: Self-pay | Admitting: Nurse Practitioner

## 2016-09-14 ENCOUNTER — Other Ambulatory Visit: Payer: Self-pay | Admitting: Nurse Practitioner

## 2016-10-23 ENCOUNTER — Other Ambulatory Visit: Payer: Self-pay | Admitting: Nurse Practitioner

## 2017-01-21 ENCOUNTER — Other Ambulatory Visit: Payer: Self-pay | Admitting: Nurse Practitioner

## 2017-02-28 ENCOUNTER — Other Ambulatory Visit: Payer: Self-pay | Admitting: Nurse Practitioner

## 2017-03-11 NOTE — Progress Notes (Deleted)
Cardiology Office Note    Date:  03/11/2017   ID:  AREK SPADAFORE, DOB 11-30-1955, MRN [id number]  PCP:  Halina Maidens., MD (Inactive)  Cardiologist:  Dr. Excell Seltzer  Chief Complaint: yearly follow up for CAD  History of Present Illness:   DEAKON FRIX is a 61 y.o. male with hx of CAD, HLD, HTN and remote tobacco smoking presents for follow up.  He has known CAD with prior anterior MI in 2012 treated with primary PCI with DES to the LAD. Then staged intervention to the RCA.   He was doing well on cardiac stand point when last seen by APP 03/14/16.  Here today for follow up.    Past Medical History:  Diagnosis Date  . CAD (coronary artery disease)    ant STEMI 11/06/10 tx with DES to LAD. dLM 20-30%; mCFX 30-40%; EF 60%. s/p DES to RCA 11/08/10.   . Glucose intolerance (impaired glucose tolerance)    A1C 5.9 1/12  . HLD (hyperlipidemia)   . HTN (hypertension)     No past surgical history on file.  Current Medications: Prior to Admission medications   Medication Sig Start Date End Date Taking? Authorizing Provider  amLODipine (NORVASC) 10 MG tablet TAKE 1 TABLET (10 MG TOTAL) BY MOUTH DAILY. 03/14/16   Rosalio Macadamia, NP  aspirin 81 MG EC tablet Take 81 mg by mouth daily.      [provider]  carvedilol (COREG) 25 MG tablet TAKE 1 TABLET (25 MG TOTAL) BY MOUTH 2 (TWO) TIMES DAILY. 01/23/17   Rosalio Macadamia, NP  lisinopril (PRINIVIL,ZESTRIL) 20 MG tablet TAKE 1 TABLET (20 MG TOTAL) BY MOUTH DAILY. 03/14/16   Rosalio Macadamia, NP  rosuvastatin (CRESTOR) 40 MG tablet TAKE 1 TABLET (40 MG TOTAL) BY MOUTH DAILY. 03/14/16   Rosalio Macadamia, NP  sildenafil (REVATIO) 20 MG tablet TAKE 5 TABLETS BY MOUTH AS NEEDED PRIOR TO SEXUAL ACTIVITY 03/01/17   Rosalio Macadamia, NP    Allergies:   Patient has no known allergies.   Social History   Social History  . Marital status: Married    Spouse name: N/A  . Number of children: N/A  . Years of education: N/A   Social  History Main Topics  . Smoking status: Light Tobacco Smoker  . Smokeless tobacco: Not on file     Comment: quit after MI in 1/12  . Alcohol use Yes     Comment: on weekeds; not a daily drinker.   . Drug use: No  . Sexual activity: Not on file   Other Topics Concern  . Not on file   Social History Narrative   Married, grown children.    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      Family History:  The patient's family history includes Heart Problems in his mother. ***  ROS:   Please see the history of present illness.    ROS All other systems reviewed and are negative.   PHYSICAL EXAM:   VS:  There were no vitals taken for this visit.   GEN: Well nourished, well developed, in no acute distress  HEENT: normal  Neck: no JVD, carotid bruits, or masses Cardiac: ***RRR; no murmurs, rubs, or gallops,no edema  Respiratory:  clear to auscultation bilaterally, normal work of breathing GI: soft, nontender, nondistended, + BS MS: no deformity or atrophy  Skin: warm and dry, no rash Neuro:  Alert and Oriented x 3, Strength and sensation  are intact Psych: euthymic mood, full affect  Wt Readings from Last 3 Encounters:  03/14/16 211 lb 6.4 oz (95.9 kg)  10/10/14 218 lb 6.4 oz (99.1 kg)  08/30/13 219 lb (99.3 kg)      Studies/Labs Reviewed:   EKG:  EKG is ordered today.  The ekg ordered today demonstrates ***  Recent Labs: 03/14/2016: ALT 12; BUN 14; Creat 1.18; Potassium 3.7; Sodium 141   Lipid Panel    Component Value Date/Time   CHOL 116 (L) 03/14/2016 1127   TRIG 74 03/14/2016 1127   HDL 36 (L) 03/14/2016 1127   CHOLHDL 3.2 03/14/2016 1127   VLDL 15 03/14/2016 1127   LDLCALC 65 03/14/2016 1127    Additional studies/ records that were reviewed today include:   As above    ASSESSMENT & PLAN:    1. CAD with pror MI and stenting to LAD and RCA  2. HTN  3. HLD - 03/14/2016: Cholesterol 116; HDL 36; LDL Cholesterol 65; Triglycerides 74; VLDL 15  -  Will repeat labs  ***.  Continue statin.     Medication Adjustments/Labs and Tests Ordered: Current medicines are reviewed at length with the patient today.  Concerns regarding medicines are outlined above.  Medication changes, Labs and Tests ordered today are listed in the Patient Instructions below. There are no Patient Instructions on file for this visit.   Lorelei PontSigned, Lijah Bourque, [place]  03/11/2017 7:44 AM    [facility] [address], [place], [place]  [address] Phone: [phone]) [phone]; Fax: [phone]

## 2017-03-14 ENCOUNTER — Ambulatory Visit: Payer: 59 | Admitting: Physician Assistant

## 2017-03-21 ENCOUNTER — Encounter (INDEPENDENT_AMBULATORY_CARE_PROVIDER_SITE_OTHER): Payer: Self-pay

## 2017-03-21 ENCOUNTER — Encounter: Payer: Self-pay | Admitting: Nurse Practitioner

## 2017-03-21 ENCOUNTER — Ambulatory Visit (INDEPENDENT_AMBULATORY_CARE_PROVIDER_SITE_OTHER): Payer: BLUE CROSS/BLUE SHIELD | Admitting: Nurse Practitioner

## 2017-03-21 VITALS — BP 140/88 | HR 63 | Ht 69.0 in | Wt 210.8 lb

## 2017-03-21 DIAGNOSIS — I259 Chronic ischemic heart disease, unspecified: Secondary | ICD-10-CM | POA: Diagnosis not present

## 2017-03-21 DIAGNOSIS — I10 Essential (primary) hypertension: Secondary | ICD-10-CM | POA: Diagnosis not present

## 2017-03-21 DIAGNOSIS — E7800 Pure hypercholesterolemia, unspecified: Secondary | ICD-10-CM

## 2017-03-21 LAB — HEPATIC FUNCTION PANEL
ALT: 18 IU/L (ref 0–44)
AST: 21 IU/L (ref 0–40)
Albumin: 4.1 g/dL (ref 3.5–5.5)
Alkaline Phosphatase: 63 IU/L (ref 39–117)
Bilirubin Total: 0.4 mg/dL (ref 0.0–1.2)
Bilirubin, Direct: 0.12 mg/dL (ref 0.00–0.40)
Total Protein: 6.7 g/dL (ref 6.0–8.5)

## 2017-03-21 LAB — LIPID PANEL
Chol/HDL Ratio: 2.4 ratio (ref 0.0–5.0)
Cholesterol, Total: 91 mg/dL — ABNORMAL LOW (ref 100–199)
HDL: 38 mg/dL — ABNORMAL LOW (ref >39)
LDL Calculated: 41 mg/dL (ref 0–99)
Triglycerides: 62 mg/dL (ref 0–149)
VLDL Cholesterol Cal: 12 mg/dL (ref 5–40)

## 2017-03-21 LAB — BASIC METABOLIC PANEL
BUN/Creatinine Ratio: 10 (ref 9–20)
BUN: 11 mg/dL (ref 6–24)
CO2: 23 mmol/L (ref 18–29)
Calcium: 9.2 mg/dL (ref 8.7–10.2)
Chloride: 105 mmol/L (ref 96–106)
Creatinine, Ser: 1.15 mg/dL (ref 0.76–1.27)
GFR calc Af Amer: 81 mL/min/{1.73_m2} (ref >59)
GFR calc non Af Amer: 70 mL/min/{1.73_m2} (ref >59)
Glucose: 102 mg/dL — ABNORMAL HIGH (ref 65–99)
Potassium: 4.2 mmol/L (ref 3.5–5.2)
Sodium: 140 mmol/L (ref 134–144)

## 2017-03-21 NOTE — Progress Notes (Signed)
CARDIOLOGY OFFICE NOTE  Date:  03/21/2017    Peter Skinner Date of Birth: [birth year] Medical Record #[id number]  PCP:  Halina Maidens., MD (Inactive)  Cardiologist:  Kirt Boys  Chief Complaint  Patient presents with  . Coronary Artery Disease    Follow up visit - seen for Dr. Excell Seltzer    History of Present Illness: Peter Skinner is a 61 y.o. male who presents today for a follow up visit. Seen for Dr. Excell Seltzer.   He has known CAD with prior anterior MI in 2012 treated with primary PCI with DES to the LAD. Then staged intervention to the RCA. He has had normal LV function. Other issues include ED, HTN and HLD. + smoker.   Last seen in May of 2017. Had ran out of his medicines. Fortunately was feeling ok.   Comes back today. Here alone. Doing well. Has not had his medicines today but says he is taking as prescribed - just forgot today since he had to fast. No chest Skinner. Breathing is ok. He is back smoking some - says it relaxes him. A pack of cigarettes will last for a few days. He push mows his yard and does fine. Energy level is good. He has no real concerns.   Past Medical History:  Diagnosis Date  . CAD (coronary artery disease)    ant STEMI 11/06/10 tx with DES to LAD. dLM 20-30%; mCFX 30-40%; EF 60%. s/p DES to RCA 11/08/10.   . Glucose intolerance (impaired glucose tolerance)    A1C 5.9 1/12  . HLD (hyperlipidemia)   . HTN (hypertension)     No past surgical history on file.   Medications: Current Outpatient Prescriptions  Medication Sig Dispense Refill  . amLODipine (NORVASC) 10 MG tablet TAKE 1 TABLET (10 MG TOTAL) BY MOUTH DAILY. 90 tablet 3  . aspirin 81 MG EC tablet Take 81 mg by mouth daily.      . carvedilol (COREG) 25 MG tablet TAKE 1 TABLET (25 MG TOTAL) BY MOUTH 2 (TWO) TIMES DAILY. 120 tablet 0  . lisinopril (PRINIVIL,ZESTRIL) 20 MG tablet TAKE 1 TABLET (20 MG TOTAL) BY MOUTH DAILY. 90 tablet 3  . rosuvastatin (CRESTOR) 40 MG tablet  TAKE 1 TABLET (40 MG TOTAL) BY MOUTH DAILY. 90 tablet 3  . sildenafil (REVATIO) 20 MG tablet TAKE 5 TABLETS BY MOUTH AS NEEDED PRIOR TO SEXUAL ACTIVITY 60 tablet 0   No current facility-administered medications for this visit.     Allergies: No Known Allergies  Social History: The patient  reports that he has been smoking.  He has never used smokeless tobacco. He reports that he drinks alcohol. He reports that he does not use drugs.   Family History: The patient's family history includes Heart Problems in his mother.   Review of Systems: Please see the history of present illness.   Otherwise, the review of systems is positive for none.   All other systems are reviewed and negative.   Physical Exam: VS:  BP 140/88 (BP Location: Left Arm, Patient Position: Sitting, Cuff Size: Normal)   Pulse 63   Ht 5\' 9"  (1.753 m)   Wt 210 lb 12.8 oz (95.6 kg)   BMI 31.13 kg/m  .  BMI Body mass index is 31.13 kg/m.  Wt Readings from Last 3 Encounters:  03/21/17 210 lb 12.8 oz (95.6 kg)  03/14/16 211 lb 6.4 oz (95.9 kg)  10/10/14 218 lb 6.4 oz (99.1 kg)  General: Pleasant. Well developed, well nourished and in no acute distress.   HEENT: Normal.  Neck: Supple, no JVD, carotid bruits, or masses noted.  Cardiac: Regular rate and rhythm. No murmurs, rubs, or gallops. No edema.  Respiratory:  Lungs are clear to auscultation bilaterally with normal work of breathing.  GI: Soft and nontender.  MS: No deformity or atrophy. Gait and ROM intact.  Skin: Warm and dry. Color is normal.  Neuro:  Strength and sensation are intact and no gross focal deficits noted.  Psych: Alert, appropriate and with normal affect.   LABORATORY DATA:  EKG:  EKG is ordered today. This demonstrates NSR - nonspecific changes - unchanged.  Lab Results  Component Value Date   WBC 8.0 11/09/2010   HGB 14.5 11/09/2010   HCT 44.5 11/09/2010   PLT 204 11/09/2010   GLUCOSE 103 (H) 03/14/2016   CHOL 116 (L) 03/14/2016     TRIG 74 03/14/2016   HDL 36 (L) 03/14/2016   LDLCALC 65 03/14/2016   ALT 12 03/14/2016   AST 18 03/14/2016   NA 141 03/14/2016   K 3.7 03/14/2016   CL 108 03/14/2016   CREATININE 1.18 03/14/2016   BUN 14 03/14/2016   CO2 26 03/14/2016   TSH 0.402 11/06/2010   INR 1.13 11/08/2010   HGBA1C (H) 11/06/2010    5.9 (NOTE)                                                                       According to the ADA Clinical Practice Recommendations for 2011, when HbA1c is used as a screening test:   >=6.5%   Diagnostic of Diabetes Mellitus           (if abnormal result  is confirmed)  5.7-6.4%   Increased risk of developing Diabetes Mellitus  References:Diagnosis and Classification of Diabetes Mellitus,Diabetes Care,2011,34(Suppl 1):S62-S69 and Standards of Medical Care in         Diabetes - 2011,Diabetes Care,2011,34  (Suppl 1):S11-S61.    BNP (last 3 results) No results for input(s): BNP in the last 8760 hours.  ProBNP (last 3 results) No results for input(s): PROBNP in the last 8760 hours.   Other Studies Reviewed Today:   Assessment/Plan:  1. CAD - prior MI/PCI - stable clinically without symptoms. Continue with current regimen. Needs continued CV risk factor modification with smoking cessation. See back in one year.   2. HTN - BP ok - no medicines yet today. He is taking his medicines as prescribed.   3. HLD - On statin therapy - we will check labs checked today - he is fasting.  4. ED - using Revatio due to cost savings.  5. Tobacco abuse - total cessation advised. Discussed at length today.   Current medicines are reviewed with the patient today.  The patient does not have concerns regarding medicines other than what has been noted above.  The following changes have been made:  See above.  Labs/ tests ordered today include:    Orders Placed This Encounter  Procedures  . Basic metabolic panel  . Hepatic function panel  . Lipid panel  . EKG 12-Lead      Disposition:   FU with me or Dr. Excell Seltzer in one  year.   Patient is agreeable to this plan and will call if any problems develop in the interim.   SignedNorma Fredrickson: Chryl Holten, NP  03/21/2017 10:43 AM  [facility] [address] [place], [place]  [address] Phone: [phone]) [phone] Fax: [phone]) [phone]

## 2017-03-21 NOTE — Patient Instructions (Addendum)
We will be checking the following labs today - BMET, HPF and lipids   Medication Instructions:    Continue with your current medicines.     Testing/Procedures To Be Arranged:  N/A  Follow-Up:   See me or Dr. Excell Seltzerooper in one year with fasting labs.     Other Special Instructions:   Stop smoking!    If you need a refill on your cardiac medications before your next appointment, please call your pharmacy.   Call the [facility] office at [phone] if you have any questions, problems or concerns.

## 2017-03-23 ENCOUNTER — Other Ambulatory Visit: Payer: Self-pay | Admitting: Nurse Practitioner

## 2017-03-25 ENCOUNTER — Other Ambulatory Visit: Payer: Self-pay | Admitting: Nurse Practitioner

## 2017-03-26 ENCOUNTER — Other Ambulatory Visit: Payer: Self-pay | Admitting: Nurse Practitioner

## 2017-04-01 ENCOUNTER — Other Ambulatory Visit: Payer: Self-pay | Admitting: Nurse Practitioner

## 2017-04-06 ENCOUNTER — Other Ambulatory Visit: Payer: Self-pay | Admitting: Nurse Practitioner

## 2017-04-07 ENCOUNTER — Other Ambulatory Visit: Payer: Self-pay | Admitting: *Deleted

## 2017-04-07 MED ORDER — SILDENAFIL CITRATE 20 MG PO TABS: ORAL_TABLET | ORAL | 2 refills | Status: DC

## 2017-04-07 NOTE — Telephone Encounter (Signed)
Reviewed chart and the pt's last refill was 03/01/17 #60, no refills. Most recent request was denied per comment request to soon. The previous supply was enough 30 days.  I will authorize refill.

## 2017-04-07 NOTE — Telephone Encounter (Signed)
Patient called and requested a refill on sildenafil and wanted to know why this was refused to the pharmacy. He stated that Dr Excell Seltzerooper and Norma FredricksonLori Gerhardt has always refilled this for him. Okay to refill? Please advise. Thanks, MI

## 2017-06-23 ENCOUNTER — Other Ambulatory Visit: Payer: Self-pay | Admitting: Cardiovascular Disease

## 2017-06-23 NOTE — Telephone Encounter (Signed)
Please advise if it is ok to refill 

## 2017-08-14 ENCOUNTER — Other Ambulatory Visit: Payer: Self-pay | Admitting: Nurse Practitioner

## 2017-10-13 ENCOUNTER — Other Ambulatory Visit: Payer: Self-pay | Admitting: Nurse Practitioner

## 2017-11-23 ENCOUNTER — Other Ambulatory Visit: Payer: Self-pay | Admitting: Nurse Practitioner

## 2018-01-06 ENCOUNTER — Other Ambulatory Visit: Payer: Self-pay | Admitting: Cardiovascular Disease

## 2018-01-07 ENCOUNTER — Other Ambulatory Visit: Payer: Self-pay | Admitting: Nurse Practitioner

## 2018-02-22 ENCOUNTER — Other Ambulatory Visit: Payer: Self-pay | Admitting: Nurse Practitioner

## 2018-03-23 ENCOUNTER — Other Ambulatory Visit: Payer: Self-pay | Admitting: Cardiovascular Disease

## 2018-04-06 ENCOUNTER — Other Ambulatory Visit: Payer: Self-pay | Admitting: Nurse Practitioner

## 2018-04-08 ENCOUNTER — Other Ambulatory Visit: Payer: Self-pay | Admitting: Nurse Practitioner

## 2018-04-10 ENCOUNTER — Other Ambulatory Visit: Payer: Self-pay | Admitting: Nurse Practitioner

## 2018-04-17 ENCOUNTER — Other Ambulatory Visit: Payer: Self-pay | Admitting: Cardiovascular Disease

## 2018-05-09 ENCOUNTER — Other Ambulatory Visit: Payer: Self-pay | Admitting: Nurse Practitioner

## 2018-05-15 ENCOUNTER — Other Ambulatory Visit: Payer: Self-pay | Admitting: Cardiovascular Disease

## 2018-05-31 ENCOUNTER — Other Ambulatory Visit: Payer: Self-pay | Admitting: Nurse Practitioner

## 2018-06-04 ENCOUNTER — Telehealth: Payer: Self-pay | Admitting: Cardiovascular Disease

## 2018-06-04 ENCOUNTER — Other Ambulatory Visit: Payer: Self-pay | Admitting: Cardiovascular Disease

## 2018-06-04 ENCOUNTER — Other Ambulatory Visit: Payer: Self-pay | Admitting: Nurse Practitioner

## 2018-06-04 NOTE — Telephone Encounter (Signed)
Which pharmacy would patient like for this to be sent to?

## 2018-06-04 NOTE — Telephone Encounter (Signed)
New Message:  Pt is needing a refill for Carvedilol 25mg , Patient has an upcoming appt on 06/29/18, pt need a refill up until date of appointment.

## 2018-06-05 ENCOUNTER — Other Ambulatory Visit: Payer: Self-pay

## 2018-06-05 MED ORDER — LISINOPRIL 20 MG PO TABS: 20.0000 mg | ORAL_TABLET | Freq: Every day | ORAL | 0 refills | Status: DC

## 2018-06-07 ENCOUNTER — Encounter: Payer: Self-pay | Admitting: Physician Assistant

## 2018-06-20 ENCOUNTER — Other Ambulatory Visit: Payer: Self-pay | Admitting: Cardiovascular Disease

## 2018-06-20 ENCOUNTER — Telehealth: Payer: Self-pay | Admitting: Cardiovascular Disease

## 2018-06-20 NOTE — Telephone Encounter (Signed)
New Message:    *STAT* If patient is at the pharmacy, call can be transferred to refill team.   1. Which medications need to be refilled? (please list name of each medication and dose if known) carvedilol (COREG) 25 MG tablet and lisinopril (PRINIVIL,ZESTRIL) 20 MG tablet  2. Which pharmacy/location (including street and city if local pharmacy) is medication to be sent to?Take 1 tablet (25 mg total) by mouth 2 (two) times daily with a meal. Please keep upcoming appt in September for future refills. Thank you And Take 1 tablet (20 mg total) by mouth daily.  3. Do they need a 30 day or 90 day supply? 30 days

## 2018-06-20 NOTE — Telephone Encounter (Signed)
Called pt and left message informing pt that his medication carvedilol has already been sent to his pharmacy as requested and if he has any other problems, questions or concerns to call the office.

## 2018-06-26 ENCOUNTER — Other Ambulatory Visit: Payer: Self-pay | Admitting: Cardiovascular Disease

## 2018-06-26 NOTE — Telephone Encounter (Signed)
Pt's pharmacy is requesting a refill on sildenafil. Would Dr. Cooper like to refill this medication? Please address 

## 2018-06-29 ENCOUNTER — Ambulatory Visit: Payer: BLUE CROSS/BLUE SHIELD | Admitting: Physician Assistant

## 2018-06-29 ENCOUNTER — Encounter: Payer: Self-pay | Admitting: Physician Assistant

## 2018-06-29 VITALS — BP 138/78 | HR 58 | Ht 69.0 in | Wt 200.1 lb

## 2018-06-29 DIAGNOSIS — I2510 Atherosclerotic heart disease of native coronary artery without angina pectoris: Secondary | ICD-10-CM

## 2018-06-29 DIAGNOSIS — N529 Male erectile dysfunction, unspecified: Secondary | ICD-10-CM | POA: Diagnosis not present

## 2018-06-29 DIAGNOSIS — I10 Essential (primary) hypertension: Secondary | ICD-10-CM | POA: Diagnosis not present

## 2018-06-29 DIAGNOSIS — E785 Hyperlipidemia, unspecified: Secondary | ICD-10-CM

## 2018-06-29 MED ORDER — NITROGLYCERIN 0.4 MG SL SUBL: 0.4000 mg | SUBLINGUAL_TABLET | SUBLINGUAL | 3 refills | Status: DC | PRN

## 2018-06-29 MED ORDER — LISINOPRIL 20 MG PO TABS: 20.0000 mg | ORAL_TABLET | Freq: Every day | ORAL | 3 refills | Status: DC

## 2018-06-29 MED ORDER — LISINOPRIL 20 MG PO TABS: 20.0000 mg | ORAL_TABLET | Freq: Every day | ORAL | 0 refills | Status: DC

## 2018-06-29 MED ORDER — AMLODIPINE BESYLATE 10 MG PO TABS: 10.0000 mg | ORAL_TABLET | Freq: Every day | ORAL | 3 refills | Status: DC

## 2018-06-29 MED ORDER — CARVEDILOL 25 MG PO TABS: 25.0000 mg | ORAL_TABLET | Freq: Two times a day (BID) | ORAL | 3 refills | Status: DC

## 2018-06-29 MED ORDER — ROSUVASTATIN CALCIUM 40 MG PO TABS: ORAL_TABLET | ORAL | 3 refills | Status: DC

## 2018-06-29 MED ORDER — SILDENAFIL CITRATE 20 MG PO TABS: ORAL_TABLET | ORAL | 5 refills | Status: DC

## 2018-06-29 MED ORDER — CARVEDILOL 25 MG PO TABS: 25.0000 mg | ORAL_TABLET | Freq: Two times a day (BID) | ORAL | 0 refills | Status: DC

## 2018-06-29 NOTE — Progress Notes (Signed)
Cardiology Office Note:    Date:  06/29/2018   ID:  Peter Skinner, DOB 1956-09-11, MRN [id number]  PCP:  Halina Maidens., MD (Inactive)  Cardiologist:  Tonny Bollman, MD   Referring MD: No ref. provider found   Chief Complaint  Patient presents with  . Follow-up    CAD     History of Present Illness:    Peter Skinner is a 62 y.o. male with coronary artery disease status post anterior myocardial infarction in 2012 treated with 2 drug-eluting stents to the LAD and staged PCI with drug-eluting stent to the RCA, hypertension, hyperlipidemia.   Last seen in clinic by [name], NP in 02/2017.     Peter Skinner returns for follow-up.  He is here alone.  Overall, he has been doing well.  He denies chest discomfort, shortness of breath, syncope, orthopnea, PND.  He does have occasional lower extremity swelling, especially on the left.  He does have evidence of venous insufficiency.  We discussed wearing compression stockings and keeping his legs elevated.  He does note the swelling seems to get worse if he misses his medications.  Unfortunately, he continues to smoke cigarettes.  Prior CV studies:   The following studies were reviewed today:  GXT 12/09/11 No ST-T changes to suggest ischemia at submaximal exercise.  Past Medical History:  Diagnosis Date  . CAD (coronary artery disease)    ant STEMI 11/06/10 tx with DES to LAD. dLM 20-30%; mCFX 30-40%; EF 60%. s/p DES to RCA 11/08/10.   . Glucose intolerance (impaired glucose tolerance)    A1C 5.9 1/12  . HLD (hyperlipidemia)   . HTN (hypertension)    Surgical Hx: The patient  has no past surgical history on file.   Current Medications: Current Meds  Medication Sig  . amLODipine (NORVASC) 10 MG tablet Take 1 tablet (10 mg total) by mouth daily.  Marland Kitchen aspirin 81 MG EC tablet Take 81 mg by mouth daily.    . carvedilol (COREG) 25 MG tablet Take 1 tablet (25 mg total) by mouth 2 (two) times daily with a meal. Please keep upcoming  appt in September for future refills. Thank you  . lisinopril (PRINIVIL,ZESTRIL) 20 MG tablet Take 1 tablet (20 mg total) by mouth daily.  . rosuvastatin (CRESTOR) 40 MG tablet TAKE 1 TABLET (40 MG TOTAL) BY MOUTH DAILY.  . sildenafil (REVATIO) 20 MG tablet TAKE 5 TABLETS BY MOUTH AS NEEDED PRIOR TO SEXUAL ACTIVITY  . [DISCONTINUED] amLODipine (NORVASC) 10 MG tablet TAKE 1 TABLET BY MOUTH EVERY DAY  . [DISCONTINUED] carvedilol (COREG) 25 MG tablet Take 1 tablet (25 mg total) by mouth 2 (two) times daily with a meal. Please keep upcoming appt in September for future refills. Thank you  . [DISCONTINUED] lisinopril (PRINIVIL,ZESTRIL) 20 MG tablet Take 1 tablet (20 mg total) by mouth daily.  . [DISCONTINUED] rosuvastatin (CRESTOR) 40 MG tablet TAKE 1 TABLET (40 MG TOTAL) BY MOUTH DAILY.  . [DISCONTINUED] sildenafil (REVATIO) 20 MG tablet TAKE 5 TABLETS BY MOUTH AS NEEDED PRIOR TO SEXUAL ACTIVITY     Allergies:   Patient has no known allergies.   Social History   Tobacco Use  . Smoking status: Light Tobacco Smoker  . Smokeless tobacco: Never Used  . Tobacco comment: quit after MI in 1/12  Substance Use Topics  . Alcohol use: Yes    Comment: on weekeds; not a daily drinker.   . Drug use: No     Family Hx: The  patient's family history includes Heart Problems in his mother. There is no history of Coronary artery disease.  ROS:   Please see the history of present illness.    ROS All other systems reviewed and are negative.   EKGs/Labs/Other Test Reviewed:    EKG:  EKG is  ordered today.  The ekg ordered today demonstrates sinus bradycardia, heart rate 58, normal axis, anteroseptal Q waves, QTC 437, similar to old EKGs  Recent Labs: No results found for requested labs within last 8760 hours.   Recent Lipid Panel Lab Results  Component Value Date/Time   CHOL 91 (L) 03/21/2017 10:53 AM   TRIG 62 03/21/2017 10:53 AM   HDL 38 (L) 03/21/2017 10:53 AM   CHOLHDL 2.4 03/21/2017 10:53 AM     CHOLHDL 3.2 03/14/2016 11:27 AM   LDLCALC 41 03/21/2017 10:53 AM    Physical Exam:    VS:  BP 138/78   Pulse (!) 58   Ht 5\' 9"  (1.753 m)   Wt 200 lb 1.9 oz (90.8 kg)   SpO2 97%   BMI 29.55 kg/m     Wt Readings from Last 3 Encounters:  06/29/18 200 lb 1.9 oz (90.8 kg)  03/21/17 210 lb 12.8 oz (95.6 kg)  03/14/16 211 lb 6.4 oz (95.9 kg)     Physical Exam  Constitutional: He is oriented to person, place, and time. He appears well-developed and well-nourished. No distress.  HENT:  Head: Normocephalic and atraumatic.  Eyes: No scleral icterus.  Neck: No JVD present. No thyromegaly present.  Cardiovascular: Normal rate and regular rhythm.  No murmur heard. Pulmonary/Chest: Effort normal. He has no wheezes. He has no rales.  Abdominal: Soft. He exhibits no distension.  Musculoskeletal: He exhibits edema (trace LLE edema (+varicosities)).  Lymphadenopathy:    He has no cervical adenopathy.  Neurological: He is alert and oriented to person, place, and time.  Skin: Skin is warm and dry.  Psychiatric: He has a normal mood and affect.    ASSESSMENT & PLAN:    Coronary artery disease involving native coronary artery of native heart without angina pectoris History of anterior myocardial infarction in 2012 treated with 2 drug-eluting stents to LAD and staged PCI with drug-eluting stent to the RCA.  He is doing well without anginal symptoms.  Continue current medical therapy which includes aspirin, carvedilol, lisinopril, rosuvastatin.  Essential hypertension Fair blood pressure control.  Continue current therapy.  Continue to monitor.  Arrange follow-up BMET.  Hyperlipidemia, unspecified hyperlipidemia type Continue statin therapy.  Arrange follow-up lipids and LFTs.  Erectile dysfunction, unspecified erectile dysfunction type Sildenafil will be refilled today.  We discussed the dangers of taking as needed nitroglycerin along with PDE-5 inhibitors.   Dispo:  Return in about 1  year (around 06/30/2019) for Routine Follow Up, w/ Dr. Excell Seltzer, or Tereso Newcomer, PA-C.   Medication Adjustments/Labs and Tests Ordered: Current medicines are reviewed at length with the patient today.  Concerns regarding medicines are outlined above.  Tests Ordered: Orders Placed This Encounter  Procedures  . Basic Metabolic Panel (BMET)  . Lipid Profile  . Hepatic function panel  . EKG 12-Lead   Medication Changes: Meds ordered this encounter  Medications  . nitroGLYCERIN (NITROSTAT) 0.4 MG SL tablet    Sig: Place 1 tablet (0.4 mg total) under the tongue every 5 (five) minutes as needed.    Dispense:  25 tablet    Refill:  3  . amLODipine (NORVASC) 10 MG tablet    Sig:  Take 1 tablet (10 mg total) by mouth daily.    Dispense:  90 tablet    Refill:  3    Keep ov  . carvedilol (COREG) 25 MG tablet    Sig: Take 1 tablet (25 mg total) by mouth 2 (two) times daily with a meal. Please keep upcoming appt in September for future refills. Thank you    Dispense:  60 tablet    Refill:  0  . lisinopril (PRINIVIL,ZESTRIL) 20 MG tablet    Sig: Take 1 tablet (20 mg total) by mouth daily.    Dispense:  30 tablet    Refill:  0    Please keep upcoming appointment for future refills.  . rosuvastatin (CRESTOR) 40 MG tablet    Sig: TAKE 1 TABLET (40 MG TOTAL) BY MOUTH DAILY.    Dispense:  90 tablet    Refill:  3    Patient needs appointment for any future refills.  Please call office at [phone] to schedule appointment.  . sildenafil (REVATIO) 20 MG tablet    Sig: TAKE 5 TABLETS BY MOUTH AS NEEDED PRIOR TO SEXUAL ACTIVITY    Dispense:  60 tablet    Refill:  5    This prescription was filled on 06/26/2018. Any refills authorized will be placed on file.    Signed, Tereso Newcomer, PA-C  06/29/2018 1:17 PM    [facility] [address] [place], [place], [place]  [address] Phone: [phone]; Fax: [phone]

## 2018-06-29 NOTE — Addendum Note (Signed)
Addended by: Tarri Fuller on: 06/29/2018 01:26 PM   Modules accepted: Orders

## 2018-06-29 NOTE — Patient Instructions (Signed)
Medication Instructions:  1. REFILLS HAVE BEEN SENT IN FOR NORVASC, COREG, LISINOPRIL, CRESTOR NITROGLYCERIN AND SILDENAFIL   Labwork: FASTING LIPIDS AND LIVER PANEL AND BMET TO BE DONE IN THE FEW WEEKS   Testing/Procedures: NONE ORDERED TODAY  Follow-Up: DR. Excell Seltzer 1 YEAR   Any Other Special Instructions Will Be Listed Below (If Applicable).     If you need a refill on your cardiac medications before your next appointment, please call your pharmacy.

## 2018-07-12 ENCOUNTER — Other Ambulatory Visit: Payer: Self-pay | Admitting: Cardiovascular Disease

## 2018-07-13 ENCOUNTER — Other Ambulatory Visit: Payer: BLUE CROSS/BLUE SHIELD

## 2018-08-27 ENCOUNTER — Telehealth: Payer: Self-pay

## 2018-08-27 NOTE — Telephone Encounter (Signed)
P/A started for Sildenafil 20mg  in Cover My Meds  Key: [id number]

## 2019-01-25 ENCOUNTER — Other Ambulatory Visit: Payer: Self-pay | Admitting: Cardiovascular Disease

## 2019-02-06 ENCOUNTER — Other Ambulatory Visit: Payer: Self-pay | Admitting: Cardiovascular Disease

## 2019-02-06 NOTE — Telephone Encounter (Signed)
Pt's pharmacy is requesting a refill on sildenafil. Would Dr. Cooper like to refill this medication? Please address 

## 2019-07-05 ENCOUNTER — Other Ambulatory Visit: Payer: Self-pay | Admitting: Physician Assistant

## 2019-07-08 ENCOUNTER — Other Ambulatory Visit: Payer: Self-pay | Admitting: Cardiovascular Disease

## 2019-07-11 ENCOUNTER — Other Ambulatory Visit: Payer: Self-pay | Admitting: Physician Assistant

## 2019-07-12 ENCOUNTER — Other Ambulatory Visit: Payer: Self-pay | Admitting: Physician Assistant

## 2019-07-22 ENCOUNTER — Other Ambulatory Visit: Payer: Self-pay | Admitting: Cardiovascular Disease

## 2019-07-24 ENCOUNTER — Other Ambulatory Visit: Payer: Self-pay | Admitting: Cardiovascular Disease

## 2019-07-24 MED ORDER — SILDENAFIL CITRATE 20 MG PO TABS: ORAL_TABLET | ORAL | 5 refills | Status: DC

## 2019-07-24 NOTE — Telephone Encounter (Signed)
New Message   *STAT* If patient is at the pharmacy, call can be transferred to refill team.   1. Which medications need to be refilled? (please list name of each medication and dose if known) sildenafil (REVATIO) 20 MG tablet  2. Which pharmacy/location (including street and city if local pharmacy) is medication to be sent to? [place], [place] - [address]  3. Do they need a 30 day or 90 day supply? 90 day

## 2019-07-24 NOTE — Telephone Encounter (Signed)
Pt calling requesting a refill on sildenafil. Would Dr. Cooper like to refill this medication? Please address ?

## 2019-07-29 ENCOUNTER — Other Ambulatory Visit: Payer: Self-pay | Admitting: Physician Assistant

## 2019-08-14 ENCOUNTER — Other Ambulatory Visit: Payer: Self-pay | Admitting: Physician Assistant

## 2019-08-15 ENCOUNTER — Ambulatory Visit: Payer: BLUE CROSS/BLUE SHIELD | Admitting: Cardiovascular Disease

## 2019-08-15 ENCOUNTER — Other Ambulatory Visit: Payer: Self-pay | Admitting: Physician Assistant

## 2019-08-25 ENCOUNTER — Other Ambulatory Visit: Payer: Self-pay | Admitting: Physician Assistant

## 2019-12-06 ENCOUNTER — Ambulatory Visit: Payer: 59 | Admitting: Cardiovascular Disease

## 2019-12-06 ENCOUNTER — Other Ambulatory Visit: Payer: Self-pay

## 2019-12-06 ENCOUNTER — Encounter: Payer: Self-pay | Admitting: Cardiovascular Disease

## 2019-12-06 VITALS — BP 130/78 | HR 54 | Ht 69.0 in | Wt 197.8 lb

## 2019-12-06 DIAGNOSIS — E782 Mixed hyperlipidemia: Secondary | ICD-10-CM | POA: Diagnosis not present

## 2019-12-06 DIAGNOSIS — I10 Essential (primary) hypertension: Secondary | ICD-10-CM | POA: Diagnosis not present

## 2019-12-06 DIAGNOSIS — N529 Male erectile dysfunction, unspecified: Secondary | ICD-10-CM | POA: Diagnosis not present

## 2019-12-06 DIAGNOSIS — I2510 Atherosclerotic heart disease of native coronary artery without angina pectoris: Secondary | ICD-10-CM

## 2019-12-06 MED ORDER — ROSUVASTATIN CALCIUM 40 MG PO TABS: 40.0000 mg | ORAL_TABLET | Freq: Every day | ORAL | 3 refills | Status: DC

## 2019-12-06 MED ORDER — SILDENAFIL CITRATE 20 MG PO TABS: ORAL_TABLET | ORAL | 5 refills | Status: DC

## 2019-12-06 MED ORDER — AMLODIPINE BESYLATE 10 MG PO TABS: 10.0000 mg | ORAL_TABLET | Freq: Every day | ORAL | 3 refills | Status: DC

## 2019-12-06 MED ORDER — CARVEDILOL 25 MG PO TABS: 25.0000 mg | ORAL_TABLET | Freq: Two times a day (BID) | ORAL | 3 refills | Status: DC

## 2019-12-06 MED ORDER — NITROGLYCERIN 0.4 MG SL SUBL: 0.4000 mg | SUBLINGUAL_TABLET | SUBLINGUAL | 3 refills | Status: DC | PRN

## 2019-12-06 MED ORDER — TADALAFIL 10 MG PO TABS: 10.0000 mg | ORAL_TABLET | Freq: Every day | ORAL | 3 refills | Status: DC | PRN

## 2019-12-06 MED ORDER — LISINOPRIL 20 MG PO TABS: 20.0000 mg | ORAL_TABLET | Freq: Every day | ORAL | 3 refills | Status: DC

## 2019-12-06 NOTE — Progress Notes (Signed)
Cardiology Office Note:    Date:  12/06/2019   ID:  Peter Skinner, DOB 07/10/1956, MRN [id number]  PCP:  Halina Maidens., MD (Inactive)  Cardiologist:  Tonny Bollman, MD  Electrophysiologist:  None   Referring MD: No ref. provider found   Chief Complaint  Patient presents with  . Coronary Artery Disease    History of Present Illness:    Peter Skinner is a 64 y.o. male with a hx of coronary artery disease, presenting for follow-up evaluation.  The patient initially presented with an anterior wall MI in 2012 and he was treated with primary PCI using 2 drug-eluting stents in the LAD.  He then underwent staged PCI with a drug-eluting stent to the RCA during that same admission.  Comorbid conditions include hypertension and mixed hyperlipidemia.  He has done well over the last 9 years without any recurrent ischemic events.  He specifically denies chest pain, chest pressure, shortness of breath, heart palpitations, orthopnea, or PND.  He complains of erectile dysfunction.  He takes sildenafil as needed but has had suboptimal results.  He is not sure that he can afford any other erectile dysfunction medication.  Past Medical History:  Diagnosis Date  . CAD (coronary artery disease)    ant STEMI 11/06/10 tx with DES to LAD. dLM 20-30%; mCFX 30-40%; EF 60%. s/p DES to RCA 11/08/10.   . Glucose intolerance (impaired glucose tolerance)    A1C 5.9 1/12  . HLD (hyperlipidemia)   . HTN (hypertension)     History reviewed. No pertinent surgical history.  Current Medications: Current Meds  Medication Sig  . amLODipine (NORVASC) 10 MG tablet Take 1 tablet (10 mg total) by mouth daily.  Marland Kitchen aspirin 81 MG EC tablet Take 81 mg by mouth daily.    . carvedilol (COREG) 25 MG tablet Take 1 tablet (25 mg total) by mouth 2 (two) times daily with a meal.  . lisinopril (ZESTRIL) 20 MG tablet Take 1 tablet (20 mg total) by mouth daily.  . nitroGLYCERIN (NITROSTAT) 0.4 MG SL tablet Place 1 tablet (0.4  mg total) under the tongue every 5 (five) minutes as needed.  . rosuvastatin (CRESTOR) 40 MG tablet Take 1 tablet (40 mg total) by mouth daily.  . sildenafil (REVATIO) 20 MG tablet Take 2-5 tablets once daily as needed prior to sexual activity.  . [DISCONTINUED] amLODipine (NORVASC) 10 MG tablet Take 1 tablet (10 mg total) by mouth daily. Please keep upcoming appt in February with Dr. Excell Seltzer before anymore refills. Thank you  . [DISCONTINUED] carvedilol (COREG) 25 MG tablet TAKE 1 TABLET (25 MG TOTAL) BY MOUTH 2 (TWO) TIMES DAILY WITH A MEAL.  . [DISCONTINUED] lisinopril (ZESTRIL) 20 MG tablet Take 1 tablet (20 mg total) by mouth daily. Please keep upcoming appt in February with Dr. Excell Seltzer before anymore refills thank you  . [DISCONTINUED] nitroGLYCERIN (NITROSTAT) 0.4 MG SL tablet Place 1 tablet (0.4 mg total) under the tongue every 5 (five) minutes as needed.  . [DISCONTINUED] rosuvastatin (CRESTOR) 40 MG tablet TAKE 1 TABLET BY MOUTH EVERY DAY. Please keep upcoming appt in February with Dr. Excell Seltzer before anymore refills. Thank you  . [DISCONTINUED] sildenafil (REVATIO) 20 MG tablet Take 2-5 tablets once daily as needed prior to sexual activity.     Allergies:   Patient has no known allergies.   Social History   Socioeconomic History  . Marital status: Married    Spouse name: Not on file  . Number of children: Not  on file  . Years of education: Not on file  . Highest education level: Not on file  Occupational History  . Not on file  Tobacco Use  . Smoking status: Light Tobacco Smoker  . Smokeless tobacco: Never Used  . Tobacco comment: quit after MI in 1/12  Substance and Sexual Activity  . Alcohol use: Yes    Comment: on weekeds; not a daily drinker.   . Drug use: No  . Sexual activity: Not on file  Other Topics Concern  . Not on file  Social History Narrative   Married, grown children.    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    Social Determinants of Health    Financial Resource Strain:   . Difficulty of Paying Living Expenses: Not on file  Food Insecurity:   . Worried About Programme researcher, broadcasting/film/video in the Last Year: Not on file  . Ran Out of Food in the Last Year: Not on file  Transportation Needs:   . Lack of Transportation (Medical): Not on file  . Lack of Transportation (Non-Medical): Not on file  Physical Activity:   . Days of Exercise per Week: Not on file  . Minutes of Exercise per Session: Not on file  Stress:   . Feeling of Stress : Not on file  Social Connections:   . Frequency of Communication with Friends and Family: Not on file  . Frequency of Social Gatherings with Friends and Family: Not on file  . Attends Religious Services: Not on file  . Active Member of Clubs or Organizations: Not on file  . Attends Banker Meetings: Not on file  . Marital Status: Not on file     Family History: The patient's family history includes Heart Problems in his mother. There is no history of Coronary artery disease.  ROS:   Please see the history of present illness.    All other systems reviewed and are negative.  EKGs/Labs/Other Studies Reviewed:    KG:  EKG is ordered today.  The ekg ordered today demonstrates sinus bradycardia 54 bpm, possible age-indeterminate septal MI, otherwise normal.  Recent Labs: No results found for requested labs within last 8760 hours.  Recent Lipid Panel    Component Value Date/Time   CHOL 91 (L) 03/21/2017 1053   TRIG 62 03/21/2017 1053   HDL 38 (L) 03/21/2017 1053   CHOLHDL 2.4 03/21/2017 1053   CHOLHDL 3.2 03/14/2016 1127   VLDL 15 03/14/2016 1127   LDLCALC 41 03/21/2017 1053    Physical Exam:    VS:  BP 130/78   Pulse (!) 54   Ht 5\' 9"  (1.753 m)   Wt 197 lb 12.8 oz (89.7 kg)   SpO2 97%   BMI 29.21 kg/m     Wt Readings from Last 3 Encounters:  12/06/19 197 lb 12.8 oz (89.7 kg)  06/29/18 200 lb 1.9 oz (90.8 kg)  03/21/17 210 lb 12.8 oz (95.6 kg)     GEN:  Well nourished,  well developed in no acute distress HEENT: Normal NECK: No JVD; No carotid bruits LYMPHATICS: No lymphadenopathy CARDIAC: RRR, no murmurs, rubs, gallops RESPIRATORY:  Clear to auscultation without rales, wheezing or rhonchi  ABDOMEN: Soft, non-tender, non-distended MUSCULOSKELETAL:  No edema; No deformity  SKIN: Warm and dry NEUROLOGIC:  Alert and oriented x 3 PSYCHIATRIC:  Normal affect   ASSESSMENT:    1. Coronary artery disease involving native coronary artery of native heart without angina pectoris   2.  Essential hypertension   3. Mixed hyperlipidemia   4. Erectile dysfunction, unspecified erectile dysfunction type    PLAN:    In order of problems listed above:  1. Stable without symptoms of angina.  The patient is on a good medical program with aspirin, beta-blocker, and high intensity statin drug. 2. Blood pressure well controlled on a combination of amlodipine, carvedilol, and lisinopril.  Will update labs to include a metabolic panel today. 3. Treated with rosuvastatin 40 mg.  Last lipids appear to be in 2018.  Patient is fasting, will do labs today. 4. Prescription written for tadalafil to see if he has a better response.  He understands the contraindication with nitroglycerin.  This is reviewed specifically with him today.   Medication Adjustments/Labs and Tests Ordered: Current medicines are reviewed at length with the patient today.  Concerns regarding medicines are outlined above.  Orders Placed This Encounter  Procedures  . CBC with Differential/Platelet  . Comprehensive metabolic panel  . Lipid panel  . EKG 12-Lead   Meds ordered this encounter  Medications  . tadalafil (CIALIS) 10 MG tablet    Sig: Take 1 tablet (10 mg total) by mouth daily as needed for erectile dysfunction.    Dispense:  10 tablet    Refill:  3  . sildenafil (REVATIO) 20 MG tablet    Sig: Take 2-5 tablets once daily as needed prior to sexual activity.    Dispense:  60 tablet    Refill:   5  . amLODipine (NORVASC) 10 MG tablet    Sig: Take 1 tablet (10 mg total) by mouth daily.    Dispense:  90 tablet    Refill:  3  . carvedilol (COREG) 25 MG tablet    Sig: Take 1 tablet (25 mg total) by mouth 2 (two) times daily with a meal.    Dispense:  180 tablet    Refill:  3  . lisinopril (ZESTRIL) 20 MG tablet    Sig: Take 1 tablet (20 mg total) by mouth daily.    Dispense:  90 tablet    Refill:  3  . nitroGLYCERIN (NITROSTAT) 0.4 MG SL tablet    Sig: Place 1 tablet (0.4 mg total) under the tongue every 5 (five) minutes as needed.    Dispense:  25 tablet    Refill:  3  . rosuvastatin (CRESTOR) 40 MG tablet    Sig: Take 1 tablet (40 mg total) by mouth daily.    Dispense:  90 tablet    Refill:  3    Patient Instructions  Medication Instructions:  1) Sildenafil has been called in for you. 2) Cialis has been called in for you as well.  Please check pricing - you will only need to have one medication. *If you need a refill on your cardiac medications before your next appointment, please call your pharmacy*  Lab Work: TODAY: CBC, CMET, lipids If you have labs (blood work) drawn today and your tests are completely normal, you will receive your results only by: Marland Kitchen MyChart Message (if you have MyChart) OR . A paper copy in the mail If you have any lab test that is abnormal or we need to change your treatment, we will call you to review the results.  Follow-Up: At [facility], you and your health needs are our priority.  As part of our continuing mission to provide you with exceptional heart care, we have created designated Provider Care Teams.  These Care Teams include your primary  Cardiologist (physician) and Advanced Practice Providers (APPs -  Physician Assistants and Nurse Practitioners) who all work together to provide you with the care you need, when you need it. Your next appointment:   12 month(s) The format for your next appointment:   In Person Provider:   You  may see Tonny Bollman, MD or one of the following Advanced Practice Providers on your designated Care Team:    Tereso Newcomer, PA-C  Vin [place], PA-C  [place], [place]    Signed, Tonny Bollman, MD  12/06/2019 12:38 PM    [facility]

## 2019-12-06 NOTE — Patient Instructions (Signed)
Medication Instructions:  1) Sildenafil has been called in for you. 2) Cialis has been called in for you as well.  Please check pricing - you will only need to have one medication. *If you need a refill on your cardiac medications before your next appointment, please call your pharmacy*  Lab Work: TODAY: CBC, CMET, lipids If you have labs (blood work) drawn today and your tests are completely normal, you will receive your results only by: Marland Kitchen MyChart Message (if you have MyChart) OR . A paper copy in the mail If you have any lab test that is abnormal or we need to change your treatment, we will call you to review the results.  Follow-Up: At [facility], you and your health needs are our priority.  As part of our continuing mission to provide you with exceptional heart care, we have created designated Provider Care Teams.  These Care Teams include your primary Cardiologist (physician) and Advanced Practice Providers (APPs -  Physician Assistants and Nurse Practitioners) who all work together to provide you with the care you need, when you need it. Your next appointment:   12 month(s) The format for your next appointment:   In Person Provider:   You may see Tonny Bollman, MD or one of the following Advanced Practice Providers on your designated Care Team:    Tereso Newcomer, PA-C  Vin [place], [place]  [place], [place]

## 2019-12-07 LAB — CBC WITH DIFFERENTIAL/PLATELET
Basophils Absolute: 0 10*3/uL (ref 0.0–0.2)
Basos: 1 %
EOS (ABSOLUTE): 0.3 10*3/uL (ref 0.0–0.4)
Eos: 8 %
Hematocrit: 45.7 % (ref 37.5–51.0)
Hemoglobin: 15.2 g/dL (ref 13.0–17.7)
Immature Grans (Abs): 0 10*3/uL (ref 0.0–0.1)
Immature Granulocytes: 0 %
Lymphocytes Absolute: 1.7 10*3/uL (ref 0.7–3.1)
Lymphs: 45 %
MCH: 28 pg (ref 26.6–33.0)
MCHC: 33.3 g/dL (ref 31.5–35.7)
MCV: 84 fL (ref 79–97)
Monocytes Absolute: 0.6 10*3/uL (ref 0.1–0.9)
Monocytes: 16 %
Neutrophils Absolute: 1.1 10*3/uL — ABNORMAL LOW (ref 1.4–7.0)
Neutrophils: 30 %
Platelets: 181 10*3/uL (ref 150–450)
RBC: 5.43 x10E6/uL (ref 4.14–5.80)
RDW: 14.6 % (ref 11.6–15.4)
WBC: 3.7 10*3/uL (ref 3.4–10.8)

## 2019-12-07 LAB — COMPREHENSIVE METABOLIC PANEL
ALT: 13 IU/L (ref 0–44)
AST: 19 IU/L (ref 0–40)
Albumin/Globulin Ratio: 1.6 (ref 1.2–2.2)
Albumin: 4.1 g/dL (ref 3.8–4.8)
Alkaline Phosphatase: 68 IU/L (ref 39–117)
BUN/Creatinine Ratio: 12 (ref 10–24)
BUN: 14 mg/dL (ref 8–27)
Bilirubin Total: 0.3 mg/dL (ref 0.0–1.2)
CO2: 24 mmol/L (ref 20–29)
Calcium: 9.1 mg/dL (ref 8.6–10.2)
Chloride: 105 mmol/L (ref 96–106)
Creatinine, Ser: 1.17 mg/dL (ref 0.76–1.27)
GFR calc Af Amer: 77 mL/min/{1.73_m2} (ref >59)
GFR calc non Af Amer: 67 mL/min/{1.73_m2} (ref >59)
Globulin, Total: 2.6 g/dL (ref 1.5–4.5)
Glucose: 98 mg/dL (ref 65–99)
Potassium: 3.5 mmol/L (ref 3.5–5.2)
Sodium: 143 mmol/L (ref 134–144)
Total Protein: 6.7 g/dL (ref 6.0–8.5)

## 2019-12-07 LAB — LIPID PANEL
Chol/HDL Ratio: 2.4 ratio (ref 0.0–5.0)
Cholesterol, Total: 99 mg/dL — ABNORMAL LOW (ref 100–199)
HDL: 41 mg/dL (ref >39)
LDL Chol Calc (NIH): 43 mg/dL (ref 0–99)
Triglycerides: 68 mg/dL (ref 0–149)
VLDL Cholesterol Cal: 15 mg/dL (ref 5–40)

## 2020-06-19 ENCOUNTER — Other Ambulatory Visit: Payer: Self-pay | Admitting: Cardiovascular Disease

## 2020-08-27 ENCOUNTER — Telehealth: Payer: Self-pay | Admitting: Cardiovascular Disease

## 2020-08-27 NOTE — Telephone Encounter (Signed)
Informed the patient he may try nasal spray, Coricidin products, and plain Claritin to help relieve symptoms. He was grateful for assistance.

## 2020-08-27 NOTE — Telephone Encounter (Signed)
Patient came to office complainign of sinus issue and was wodnerign what mediciations he could takr to not interfer with his heart. If you can give him a call with this info and print him out a list of the meds he can take that would be appreciated.

## 2020-12-02 ENCOUNTER — Other Ambulatory Visit: Payer: Self-pay | Admitting: Cardiovascular Disease

## 2020-12-02 NOTE — Telephone Encounter (Signed)
Pt's pharmacy is requesting a refill on sildenafil. Would Dr. Cooper like to refill this medication? Please address 

## 2020-12-03 NOTE — Telephone Encounter (Signed)
Confirmed with the patient he is only taking sildenafil (both sildenafil and Cialis were called in at visit for pricing).  Updated medication list. Sildenafil refilled. The patient was grateful for call and agrees with plan.

## 2020-12-16 ENCOUNTER — Other Ambulatory Visit: Payer: Self-pay | Admitting: Cardiovascular Disease

## 2021-01-07 ENCOUNTER — Other Ambulatory Visit: Payer: Self-pay | Admitting: Cardiovascular Disease

## 2021-01-20 ENCOUNTER — Other Ambulatory Visit: Payer: Self-pay | Admitting: Cardiovascular Disease

## 2021-02-15 ENCOUNTER — Other Ambulatory Visit: Payer: Self-pay | Admitting: Cardiovascular Disease

## 2021-02-15 MED ORDER — CARVEDILOL 25 MG PO TABS: 25.0000 mg | ORAL_TABLET | Freq: Two times a day (BID) | ORAL | 0 refills | Status: DC

## 2021-04-01 ENCOUNTER — Other Ambulatory Visit: Payer: Self-pay | Admitting: Cardiovascular Disease

## 2021-04-02 ENCOUNTER — Other Ambulatory Visit: Payer: Self-pay | Admitting: Cardiovascular Disease

## 2021-05-13 ENCOUNTER — Other Ambulatory Visit: Payer: Self-pay | Admitting: Cardiovascular Disease

## 2021-05-25 ENCOUNTER — Encounter: Payer: Self-pay | Admitting: Physician Assistant

## 2021-05-25 NOTE — Progress Notes (Addendum)
ie   Cardiology Office Note    Date:  05/26/2021   ID:  Peter Skinner, DOB 23-May-1956, MRN [id number]  PCP:  Peter Skinner., MD (Inactive)  Cardiologist:  Peter Bollman, MD  Electrophysiologist:  None   Chief Complaint: overdue follow-up of CAD  History of Present Illness:   Peter Skinner is a 65 y.o. male with history of CAD, HTN, HLD, impaired glucose tolerance, ED who presents for follow-up. He had an anterior wall MI in 2012 treated with primary PCI s/p DESx2 to LAD, EF 60%. He had staged PCI/DES to RCA a few days after. He has done well since that time.  He presents back to clinic today doing well. He walks a lot for his job at [employer] and does lifting without any recent anginal symptoms, dyspnea, palpitations, dizziness or syncope. He has not felt anything like prior MI. He has not been back in to see primary care for a while. He went back to sildenafil as tadalfil was too expensive. He is tolerating all medication well and feeling good. He comes in as he is due for some medication refills.   Labwork independently reviewed: 11/2019 LDL 43, Tchol 99, CMET OK Cr 1.17 LFTS ok, Hgb/plt wnl   Past Medical History:  Diagnosis Date   CAD (coronary artery disease)    ant STEMI 11/06/10 tx with DES to LAD. dLM 20-30%; mCFX 30-40%; EF 60%. s/p DES to RCA 11/08/10.    ED (erectile dysfunction)    Glucose intolerance (impaired glucose tolerance)    A1C 5.9 1/12   HLD (hyperlipidemia)    HTN (hypertension)     History reviewed. No pertinent surgical history.  Current Medications: Current Meds  Medication Sig   amLODipine (NORVASC) 10 MG tablet TAKE 1 TABLET BY MOUTH EVERY DAY   aspirin 81 MG EC tablet Take 81 mg by mouth daily.     lisinopril (ZESTRIL) 20 MG tablet TAKE 1 TABLET BY MOUTH EVERY DAY   nitroGLYCERIN (NITROSTAT) 0.4 MG SL tablet Place 1 tablet (0.4 mg total) under the tongue every 5 (five) minutes as needed.   rosuvastatin (CRESTOR) 40 MG tablet TAKE 1 TABLET  BY MOUTH EVERY DAY   [DISCONTINUED] carvedilol (COREG) 25 MG tablet TAKE 1 TABLET BY MOUTH 2 TIMES DAILY WITH A MEAL. PLEASE KEEP UPCOMING APPOINTMENT IN AUGUST FOR FURTHER REFILLS   [DISCONTINUED] sildenafil (REVATIO) 20 MG tablet Take 2-5 tablets once daily as needed prior to sexual activity.      Allergies:   Patient has no known allergies.   Social History   Socioeconomic History   Marital status: Married    Spouse name: Not on file   Number of children: Not on file   Years of education: Not on file   Highest education level: Not on file  Occupational History   Not on file  Tobacco Use   Smoking status: Light Smoker   Smokeless tobacco: Never   Tobacco comments:    quit after MI in 1/12  Vaping Use   Vaping Use: Never used  Substance and Sexual Activity   Alcohol use: Yes    Comment: on weekeds; not a daily drinker.    Drug use: No   Sexual activity: Not on file  Other Topics Concern   Not on file  Social History Narrative   Married, grown children.    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    Social Determinants of Health   Financial Resource Strain: Not  on file  Food Insecurity: Not on file  Transportation Needs: Not on file  Physical Activity: Not on file  Stress: Not on file  Social Connections: Not on file     Family History:  The patient's family history includes Heart Problems in his mother. There is no history of Coronary artery disease.  ROS:   Please see the history of present illness.  All other systems are reviewed and otherwise negative.    EKGs/Labs/Other Studies Reviewed:    Studies reviewed are outlined and summarized above. Reports included below if pertinent.  Last cath 10/2019 DATE OF PROCEDURE:  11/08/2010 DATE OF DISCHARGE:                            CARDIAC CATHETERIZATION     PROCEDURE:  Stenting of the right coronary artery.   OPERATOR:  Peter Fells. Excell Seltzer, MD   Peter Skinner INDICATIONS:  Peter Skinner is a  65 year old gentleman who came in on November 06, 2010 with an anterior wall ST-elevation MI.  At the time of his catheterization, he was found to have severe stenosis of his distal right coronary artery and he presented today for staged PCI.   DESCRIPTION OF PROCEDURE:  Risks, indications of the procedure were reviewed with the patient.  Informed consent was obtained.  The left wrist was prepped, draped, and anesthetized with 1% lidocaine.  The left radial artery was accessed with a moderate amount of difficulty.  A 6- French sheath was placed.  Bivalirudin was used for anticoagulation. The patient has been adequately preloaded with aspirin and Effient. Initially, a JR-4 guide catheter was used but there was a high anterior origin of the right coronary artery that could not be reached.  An AL-1 catheter was used and fit the vessel well.  A Cougar guidewire was advanced beyond the area of stenosis and once the therapeutic ACT was achieved, the severe lesion in the distal vessel was treated with a 2.75 x 16 mm Promus drug-eluting stent.  The stent was deployed at 11 atmospheres and was positioned just to the bifurcation of the PDA and the posterior AV segment.  The stent was postdilated with a 3.0 x 79mm [place] Quantum apex balloon which was dilated to 14 atmospheres.  There was an excellent angiographic result at the completion of the procedure. There were no immediate complications.  There was TIMI 3 flow present and 0% residual stenosis.  A TR band was used for radial hemostasis.   FINAL CONCLUSIONS:  Successful percutaneous intervention of the right coronary artery using a drug-eluting stent platform.   RECOMMENDATIONS:  The patient will remain on aspirin and Effient for greater than 12 months as well as post MI medical therapy.         Peter Fells. Excell Seltzer, MD  10/2019 First cath   CARDIAC CATHETERIZATION     PROCEDURES: 1. Left heart catheterization. 2. Selective coronary  angiography. 3. Left ventricular angiography. 4. Percutaneous transluminal coronary angioplasty and stenting of the     left anterior descending artery.   PROCEDURAL INDICATIONS:  Peter Skinner is a 65 year old gentleman with no past cardiac history who presented with an anterolateral myocardial infarction.  He was brought directly from the field after a Code STEMI was activated.   Risks and indications of the procedure were reviewed with the patient, emergency consent was obtained.  The right wrist was prepped, draped and anesthetized with 1% lidocaine.  Using the modified Seldinger technique,  a 6-French sheath was placed in the right radial artery via a front wall puncture.  A 4000 units of heparin had been administered for anticoagulation, 324 mg of aspirin was given prior to arrival and 60 mg of prasugrel was given just before the patient underwent catheterization.  Bivalirudin was started soon after access as well. The right coronary artery was first imaged with a JR-4 catheter and left coronary artery was imaged directly with an XB LAD 3.5-cm guide catheter.  Ventriculography was performed with pigtail catheter. Following diagnostic angiography, PCI was performed.   DIAGNOSTIC FINDINGS:  Aortic pressure 137/72 with a mean of 112, left ventricular pressure 136/26.   Left ventriculography shows mild anteroapical hypokinesis.  The LV ejection fraction is preserved at 60%.   CORONARY ANGIOGRAPHY:  The right coronary artery has diffuse luminal irregularities.  There is nonobstructive disease throughout the proximal and mid right coronary artery.  There is a moderate-sized acute marginal branch present.  The distal right coronary artery just before the bifurcation of the PDA and posterior AV segment has a 90% eccentric stenosis present.   Left coronary artery:  The left main is patent.  There is mild distal left main stenosis of no more than 20-30%.   LAD:  The LAD is of large  caliber.  There are diffuse irregularities. The proximal LAD at the first diagonal origin has an 80-90% stenosis. The mid-LAD has heavy thrombus with a large filling defect at the origin of the second diagonal.  The filling defect extends into the second diagonal branch.  The mid and distal LAD are patent without high-grade obstructive disease and the LAD wraps around the left ventricular apex. The diagonal branches of the LAD are both patent.   Left circumflex:  The left circumflex has mild nonobstructive disease. The mid circumflex has a 30-40% stenosis.  The first OM is patent without significant stenosis.  The AV groove circumflex beyond the OM is also patent without significant stenosis.   INTERVENTIONAL NOTE:  After a therapeutic ACT was achieved, a Cougar guidewire was advanced into the apical portion of the LAD.  Aspiration thrombectomy was performed with an Export catheter.  This seemed to reduce the thrombus burden over the second lesion.  Both lesions were in close proximity at the origins of the first and second diagonals and I thought they could be covered with one stent, a 3.0 x 28-mm Promus Element stent was chosen and carefully positioned.  The stent was deployed at 11 atmospheres and appeared well expanded.  The stent was then postdilated to 14 atmospheres with a 3.25 x 20-mm [place] Quantum apex. Of note, prior to aspiration thrombectomy, intravenous eptifibatide was started via a double bolus and drip protocol.   At the completion of the interventional procedure, the stenosis was reduced from 90% down to 0%.  Flow was TIMI 3 both pre and post.  There was residual thrombus in the second diagonal branch, but it did not appear flow obstructive and there was TIMI 3 flow.  The patient tolerated the entire procedure well and there were no immediate complications.  A TR band was used for radial artery hemostasis.   FINAL ASSESSMENT: 1. Critical left anterior descending artery  stenosis with heavy     thrombus burden. 2. Severe distal right coronary artery stenosis. 3. Nonobstructive left circumflex stenosis. 4. Preserved left ventricular function. 5. Successful percutaneous intervention of the left anterior     descending artery using aspiration thrombectomy followed by  stenting with a drug-eluting stent platform.   RECOMMENDATIONS:  The patient will continue with aspirin, Effient, and Integrilin.  We will plan on staged PCI of the right coronary artery on Monday.      EKG:  EKG is ordered today, personally reviewed, demonstrating SB 57bpm, possible age indeterminate septal MI, otherwise no acute STT changes. Similar to prior.  Recent Labs: No results found for requested labs within last 8760 hours.  Recent Lipid Panel    Component Value Date/Time   CHOL 99 (L) 12/06/2019 0926   TRIG 68 12/06/2019 0926   HDL 41 12/06/2019 0926   CHOLHDL 2.4 12/06/2019 0926   CHOLHDL 3.2 03/14/2016 1127   VLDL 15 03/14/2016 1127   LDLCALC 43 12/06/2019 0926    PHYSICAL EXAM:    VS:  BP 112/72   Pulse (!) 57   Ht 5\' 9"  (1.753 m)   Wt 201 lb 9.6 oz (91.4 kg)   SpO2 98%   BMI 29.77 kg/m   BMI: Body mass index is 29.77 kg/m.  GEN: Well nourished, well developed male in no acute distress HEENT: normocephalic, atraumatic Neck: no JVD, carotid bruits, or masses Cardiac: RRR; no murmurs, rubs, or gallops, no edema  Respiratory:  clear to auscultation bilaterally, normal work of breathing GI: soft, nontender, nondistended, + BS MS: no deformity or atrophy Skin: warm and dry, no rash Neuro:  Alert and Oriented x 3, Strength and sensation are intact, follows commands Psych: euthymic mood, full affect  Wt Readings from Last 3 Encounters:  05/26/21 201 lb 9.6 oz (91.4 kg)  12/06/19 197 lb 12.8 oz (89.7 kg)  06/29/18 200 lb 1.9 oz (90.8 kg)     ASSESSMENT & PLAN:   1. CAD - doing well without any recent anginal symptoms or concerns. Continue ASA,  carvedilol, rosuvastatin. Refill sent in for carvedilol per patient request. His HR is in the mid 50s which is baseline for him. Since he is feeling good and BP is well controlled, no changes made today. Will update CMET, CBC, lipid profile today.   2. Essential HTN - well controlled on carvedilol, lisinopril, amlodipine. Check CMET today. Refill carvedilol. He has enough supply of the other prescriptions at this time per his report.  3. Hyperlipidemia goal LDL <70 - continue rosuvastatin. Check CMET/lipid profile today.  4. ED - OK to refill sildenafil. Tadalafil was more costly. Instructions provided regarding avoiding SL NTG within 48 hours of sildenafil and vice versa.    5. Impaired glucose tolerance - listed in PMH, patient unaware of this finding in the past. Glucose in 2021 was totally normal. Will follow-up blood sugar level on CMET and if elevated, have him see primary care back in follow-up to discuss further monitoring.  Disposition: F/u with Dr. 2022 in 1 year.   Medication Adjustments/Labs and Tests Ordered: Current medicines are reviewed at length with the patient today.  Concerns regarding medicines are outlined above. Medication changes, Labs and Tests ordered today are summarized above and listed in the Patient Instructions accessible in Encounters.   Signed, Excell Seltzer, PA-C  05/26/2021 10:55 AM    [facility] [address] [place], [place], [place]  [place] Phone: [phone]; Fax: [phone]

## 2021-05-26 ENCOUNTER — Other Ambulatory Visit: Payer: Self-pay

## 2021-05-26 ENCOUNTER — Encounter: Payer: Self-pay | Admitting: Physician Assistant

## 2021-05-26 ENCOUNTER — Ambulatory Visit: Payer: 59 | Admitting: Physician Assistant

## 2021-05-26 VITALS — BP 112/72 | HR 57 | Ht 69.0 in | Wt 201.6 lb

## 2021-05-26 DIAGNOSIS — I2510 Atherosclerotic heart disease of native coronary artery without angina pectoris: Secondary | ICD-10-CM

## 2021-05-26 DIAGNOSIS — I10 Essential (primary) hypertension: Secondary | ICD-10-CM | POA: Diagnosis not present

## 2021-05-26 DIAGNOSIS — E785 Hyperlipidemia, unspecified: Secondary | ICD-10-CM

## 2021-05-26 DIAGNOSIS — N529 Male erectile dysfunction, unspecified: Secondary | ICD-10-CM

## 2021-05-26 DIAGNOSIS — R7302 Impaired glucose tolerance (oral): Secondary | ICD-10-CM

## 2021-05-26 LAB — CBC
Hematocrit: 46.5 % (ref 37.5–51.0)
Hemoglobin: 15.4 g/dL (ref 13.0–17.7)
MCH: 26.7 pg (ref 26.6–33.0)
MCHC: 33.1 g/dL (ref 31.5–35.7)
MCV: 81 fL (ref 79–97)
Platelets: 200 10*3/uL (ref 150–450)
RBC: 5.76 x10E6/uL (ref 4.14–5.80)
RDW: 14.6 % (ref 11.6–15.4)
WBC: 4.4 10*3/uL (ref 3.4–10.8)

## 2021-05-26 LAB — COMPREHENSIVE METABOLIC PANEL
ALT: 10 IU/L (ref 0–44)
AST: 17 IU/L (ref 0–40)
Albumin/Globulin Ratio: 1.4 (ref 1.2–2.2)
Albumin: 4 g/dL (ref 3.8–4.8)
Alkaline Phosphatase: 68 IU/L (ref 44–121)
BUN/Creatinine Ratio: 12 (ref 10–24)
BUN: 12 mg/dL (ref 8–27)
Bilirubin Total: 0.3 mg/dL (ref 0.0–1.2)
CO2: 20 mmol/L (ref 20–29)
Calcium: 9 mg/dL (ref 8.6–10.2)
Chloride: 106 mmol/L (ref 96–106)
Creatinine, Ser: 1 mg/dL (ref 0.76–1.27)
Globulin, Total: 2.9 g/dL (ref 1.5–4.5)
Glucose: 89 mg/dL (ref 65–99)
Potassium: 3.9 mmol/L (ref 3.5–5.2)
Sodium: 139 mmol/L (ref 134–144)
Total Protein: 6.9 g/dL (ref 6.0–8.5)
eGFR: 85 mL/min/{1.73_m2} (ref >59)

## 2021-05-26 LAB — LIPID PANEL
Chol/HDL Ratio: 3.1 ratio (ref 0.0–5.0)
Cholesterol, Total: 78 mg/dL — ABNORMAL LOW (ref 100–199)
HDL: 25 mg/dL — ABNORMAL LOW (ref >39)
LDL Chol Calc (NIH): 37 mg/dL (ref 0–99)
Triglycerides: 74 mg/dL (ref 0–149)
VLDL Cholesterol Cal: 16 mg/dL (ref 5–40)

## 2021-05-26 MED ORDER — CARVEDILOL 25 MG PO TABS: 25.0000 mg | ORAL_TABLET | Freq: Two times a day (BID) | ORAL | 3 refills | Status: DC

## 2021-05-26 MED ORDER — SILDENAFIL CITRATE 20 MG PO TABS: ORAL_TABLET | ORAL | 5 refills | Status: DC

## 2021-05-26 NOTE — Patient Instructions (Addendum)
Medication Instructions:  Your physician recommends that you continue on your current medications as directed. Please refer to the Current Medication list given to you today.  *If you need a refill on your cardiac medications before your next appointment, please call your pharmacy*   Lab Work: TODAY:  CMET, LIPID, & CBC  If you have labs (blood work) drawn today and your tests are completely normal, you will receive your results only by: MyChart Message (if you have MyChart) OR A paper copy in the mail If you have any lab test that is abnormal or we need to change your treatment, we will call you to review the results.   Testing/Procedures: None ordered   Follow-Up: At [facility], you and your health needs are our priority.  As part of our continuing mission to provide you with exceptional heart care, we have created designated Provider Care Teams.  These Care Teams include your primary Cardiologist (physician) and Advanced Practice Providers (APPs -  Physician Assistants and Nurse Practitioners) who all work together to provide you with the care you need, when you need it.  We recommend signing up for the patient portal called "MyChart".  Sign up information is provided on this After Visit Summary.  MyChart is used to connect with patients for Virtual Visits (Telemedicine).  Patients are able to view lab/test results, encounter notes, upcoming appointments, etc.  Non-urgent messages can be sent to your provider as well.   To learn more about what you can do with MyChart, go to [email or web address].    Your next appointment:   12 month(s)  The format for your next appointment:   In Person  Provider:   You may see Tonny Bollman, MD or one of the following Advanced Practice Providers on your designated Care Team:   Tereso Newcomer, PA-C Chelsea Aus, [place]   Other Instructions Happy Early [place] Ouch!  Do not take nitroglycerin if you have taken sildenafil in the last 48 hours.  The opposite is true as well. Do not take sildenafil if you have taken nitroglycerin in the last 48 hours. If you take these two medicines, they can cause low blood pressure if taken close together.

## 2021-08-07 ENCOUNTER — Other Ambulatory Visit: Payer: Self-pay | Admitting: Cardiovascular Disease

## 2021-10-01 DIAGNOSIS — H6123 Impacted cerumen, bilateral: Secondary | ICD-10-CM | POA: Insufficient documentation

## 2021-10-07 ENCOUNTER — Other Ambulatory Visit: Payer: Self-pay | Admitting: Physician Assistant

## 2022-03-17 ENCOUNTER — Other Ambulatory Visit: Payer: Self-pay | Admitting: Physician Assistant

## 2022-03-17 NOTE — Telephone Encounter (Signed)
Pt's pharmacy is requesting a refill on sildenafil. Would Dr. Cooper like to refill this medication? Please address 

## 2022-03-18 NOTE — Telephone Encounter (Signed)
Pt last seen by Melina Copa on 05/26/21 with instruction to return to see Dr Burt Knack in 1 year. Per her notes, pt knows to avoid concurrent use with nitrates. Will send refill in at this time.

## 2022-05-16 ENCOUNTER — Other Ambulatory Visit: Payer: Self-pay | Admitting: Cardiovascular Disease

## 2022-06-24 ENCOUNTER — Other Ambulatory Visit: Payer: Self-pay | Admitting: Physician Assistant

## 2022-07-29 ENCOUNTER — Other Ambulatory Visit: Payer: Self-pay | Admitting: Cardiovascular Disease

## 2022-08-03 ENCOUNTER — Encounter: Payer: Self-pay | Admitting: Nurse Practitioner

## 2022-08-03 ENCOUNTER — Ambulatory Visit: Payer: 59 | Attending: Physician Assistant | Admitting: Nurse Practitioner

## 2022-08-03 VITALS — BP 138/72 | HR 58 | Ht 69.0 in | Wt 207.8 lb

## 2022-08-03 DIAGNOSIS — I10 Essential (primary) hypertension: Secondary | ICD-10-CM

## 2022-08-03 DIAGNOSIS — N529 Male erectile dysfunction, unspecified: Secondary | ICD-10-CM

## 2022-08-03 DIAGNOSIS — I2510 Atherosclerotic heart disease of native coronary artery without angina pectoris: Secondary | ICD-10-CM

## 2022-08-03 DIAGNOSIS — E785 Hyperlipidemia, unspecified: Secondary | ICD-10-CM | POA: Diagnosis not present

## 2022-08-03 LAB — COMPREHENSIVE METABOLIC PANEL
ALT: 14 IU/L (ref 0–44)
AST: 19 IU/L (ref 0–40)
Albumin/Globulin Ratio: 1.6 (ref 1.2–2.2)
Albumin: 4.2 g/dL (ref 3.9–4.9)
Alkaline Phosphatase: 73 IU/L (ref 44–121)
BUN/Creatinine Ratio: 11 (ref 10–24)
BUN: 13 mg/dL (ref 8–27)
Bilirubin Total: 0.3 mg/dL (ref 0.0–1.2)
CO2: 26 mmol/L (ref 20–29)
Calcium: 9.1 mg/dL (ref 8.6–10.2)
Chloride: 105 mmol/L (ref 96–106)
Creatinine, Ser: 1.18 mg/dL (ref 0.76–1.27)
Globulin, Total: 2.7 g/dL (ref 1.5–4.5)
Glucose: 108 mg/dL — ABNORMAL HIGH (ref 70–99)
Potassium: 3.8 mmol/L (ref 3.5–5.2)
Sodium: 143 mmol/L (ref 134–144)
Total Protein: 6.9 g/dL (ref 6.0–8.5)
eGFR: 69 mL/min/{1.73_m2} (ref >59)

## 2022-08-03 LAB — LIPID PANEL
Chol/HDL Ratio: 2.4 ratio (ref 0.0–5.0)
Cholesterol, Total: 90 mg/dL — ABNORMAL LOW (ref 100–199)
HDL: 37 mg/dL — ABNORMAL LOW (ref >39)
LDL Chol Calc (NIH): 39 mg/dL (ref 0–99)
Triglycerides: 61 mg/dL (ref 0–149)
VLDL Cholesterol Cal: 14 mg/dL (ref 5–40)

## 2022-08-03 LAB — CBC
Hematocrit: 50.6 % (ref 37.5–51.0)
Hemoglobin: 16.9 g/dL (ref 13.0–17.7)
MCH: 27.5 pg (ref 26.6–33.0)
MCHC: 33.4 g/dL (ref 31.5–35.7)
MCV: 82 fL (ref 79–97)
Platelets: 182 10*3/uL (ref 150–450)
RBC: 6.15 x10E6/uL — ABNORMAL HIGH (ref 4.14–5.80)
RDW: 14 % (ref 11.6–15.4)
WBC: 3.6 10*3/uL (ref 3.4–10.8)

## 2022-08-03 MED ORDER — AMLODIPINE BESYLATE 10 MG PO TABS: 10.0000 mg | ORAL_TABLET | Freq: Every day | ORAL | 3 refills | Status: DC

## 2022-08-03 MED ORDER — NITROGLYCERIN 0.4 MG SL SUBL: 0.4000 mg | SUBLINGUAL_TABLET | SUBLINGUAL | 3 refills | Status: DC | PRN

## 2022-08-03 MED ORDER — LISINOPRIL 20 MG PO TABS: 20.0000 mg | ORAL_TABLET | Freq: Every day | ORAL | 3 refills | Status: DC

## 2022-08-03 MED ORDER — ROSUVASTATIN CALCIUM 40 MG PO TABS: 40.0000 mg | ORAL_TABLET | Freq: Every day | ORAL | 3 refills | Status: DC

## 2022-08-03 MED ORDER — SILDENAFIL CITRATE 20 MG PO TABS: ORAL_TABLET | ORAL | 6 refills | Status: DC

## 2022-08-03 MED ORDER — CARVEDILOL 25 MG PO TABS: 25.0000 mg | ORAL_TABLET | Freq: Two times a day (BID) | ORAL | 3 refills | Status: DC

## 2022-08-03 NOTE — Patient Instructions (Signed)
Medication Instructions:  Your physician recommends that you continue on your current medications as directed. Please refer to the Current Medication list given to you today.  *If you need a refill on your cardiac medications before your next appointment, please call your pharmacy*   Lab Work: TODAY:  CMET, CBC, & LIPID  If you have labs (blood work) drawn today and your tests are completely normal, you will receive your results only by: [place] (if you have MyChart) OR A paper copy in the mail If you have any lab test that is abnormal or we need to change your treatment, we will call you to review the results.   Testing/Procedures: None ordered   Follow-Up: At [facility], you and your health needs are our priority.  As part of our continuing mission to provide you with exceptional heart care, we have created designated Provider Care Teams.  These Care Teams include your primary Cardiologist (physician) and Advanced Practice Providers (APPs -  Physician Assistants and Nurse Practitioners) who all work together to provide you with the care you need, when you need it.  We recommend signing up for the patient portal called "MyChart".  Sign up information is provided on this After Visit Summary.  MyChart is used to connect with patients for Virtual Visits (Telemedicine).  Patients are able to view lab/test results, encounter notes, upcoming appointments, etc.  Non-urgent messages can be sent to your provider as well.   To learn more about what you can do with MyChart, go to [email or web address].    Your next appointment:   5 month(s)  The format for your next appointment:   In Person  Provider:   Sherren Mocha, MD     Other Instructions   Important Information About Sugar

## 2022-08-03 NOTE — Progress Notes (Signed)
Cardiology Office Note:    Date:  08/03/2022   ID:  Peter Skinner, DOB September 12, 1956, MRN [id number]  PCP:  Peter Pun., MD (Inactive)   [facility] Prov[facility] Cardiologist:  Peter Mocha, MD     Referring MD: No ref. provider found   CC: Here for 1 year follow-up  History of Present Illness:    Peter Skinner is a 66 y.o. male with a hx of the following:  HTN CAD, s/p anterior wall MI in 2012, s/p DES x 2 to LAD and DES to RCA HLD Impaired glucose tolerance Tobacco abuse ED  In 2012 he had an anterior wall MI, treated with primary PCI and was status post drug-eluting stent x2 to LAD, had staged PCI/DES to RCA few days afterwards.  EF 60%.  Last evaluated by Peter Ku, PA on May 26, 2021.  Was doing very well from a cardiac perspective.  Heart rate was in mid 50s, baseline for him.  BP well controlled.  Medications were refilled.  Today he presents for follow-up and states he is doing well since last year. Denies any changes to his health.  Denies any chest pain, shortness of breath, palpitations, dizziness, syncope, presyncope, orthopnea, PND, swelling, bleeding, or claudication.  Has not had to use nitroglycerin at all.  He is requesting medication refill today.  Says he participates in walking for physical activity, however he knows he needs to do this more.  Stay his BP is minimally elevated due to not taking medication this morning.  Denies any other questions or concerns today.  Past Medical History:  Diagnosis Date   CAD (coronary artery disease)    ant STEMI 11/06/10 tx with DES to LAD. dLM 20-30%; mCFX 30-40%; EF 60%. s/p DES to RCA 11/08/10.    ED (erectile dysfunction)    Glucose intolerance (impaired glucose tolerance)    A1C 5.9 1/12   HLD (hyperlipidemia)    HTN (hypertension)     No past surgical history on file.  Current Medications: Current Meds  Medication Sig   aspirin 81 MG EC tablet Take 81 mg by mouth daily.     amLODipine  (NORVASC) 10 MG tablet TAKE 1 TABLET BY MOUTH EVERY DAY   carvedilol (COREG) 25 MG tablet TAKE 1 TABLET (25 MG TOTAL) BY MOUTH 2 (TWO) TIMES DAILY WITH A MEAL.   lisinopril (ZESTRIL) 20 MG tablet TAKE 1 TABLET BY MOUTH EVERY DAY   nitroGLYCERIN (NITROSTAT) 0.4 MG SL tablet Place 1 tablet (0.4 mg total) under the tongue every 5 (five) minutes as needed.   rosuvastatin (CRESTOR) 40 MG tablet TAKE 1 TABLET BY MOUTH EVERY DAY   sildenafil (REVATIO) 20 MG tablet Take 2-5 tablets once daily as needed prior to sexual activity.     Allergies:   Patient has no known allergies.   Social History   Socioeconomic History   Marital status: Married    Spouse name: Not on file   Number of children: Not on file   Years of education: Not on file   Highest education level: Not on file  Occupational History   Not on file  Tobacco Use   Smoking status: Light Smoker   Smokeless tobacco: Never   Tobacco comments:    quit after MI in 1/12  Vaping Use   Vaping Use: Never used  Substance and Sexual Activity   Alcohol use: Yes    Comment: on weekeds; not a daily drinker.    Drug use: No  Sexual activity: Not on file  Other Topics Concern   Not on file  Social History Narrative   Married, grown children.    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    Social Determinants of Health   Financial Resource Strain: Not on file  Food Insecurity: Not on file  Transportation Needs: Not on file  Physical Activity: Not on file  Stress: Not on file  Social Connections: Not on file     Family History: The patient's family history includes Heart Problems in his mother. There is no history of Coronary artery disease.  ROS:   Review of Systems  Constitutional: Negative.   HENT: Negative.    Eyes: Negative.   Respiratory:  Positive for cough. Negative for hemoptysis, sputum production, shortness of breath and wheezing.        Says he is currently dealing with a "chest cold"  Cardiovascular:  Negative.   Gastrointestinal: Negative.   Genitourinary: Negative.   Musculoskeletal: Negative.   Skin: Negative.   Neurological: Negative.   Endo/Heme/Allergies: Negative.   Psychiatric/Behavioral: Negative.      Please see the history of present illness.    All other systems reviewed and are negative.  EKGs/Labs/Other Studies Reviewed:    The following studies were reviewed today:   EKG:  EKG is ordered today.  The ekg ordered today demonstrates sinus bradycardia, previous anterior infarct, otherwise no acute changes.  Recent Labs: No results found for requested labs within last 365 days.  Recent Lipid Panel    Component Value Date/Time   CHOL 78 (L) 05/26/2021 1051   TRIG 74 05/26/2021 1051   HDL 25 (L) 05/26/2021 1051   CHOLHDL 3.1 05/26/2021 1051   CHOLHDL 3.2 03/14/2016 1127   VLDL 15 03/14/2016 1127   LDLCALC 37 05/26/2021 1051    Physical Exam:    VS:  BP 138/72   Pulse (!) 58   Ht 5' 9" (1.753 m)   Wt 207 lb 12.8 oz (94.3 kg)   SpO2 96%   BMI 30.69 kg/m     Wt Readings from Last 3 Encounters:  08/03/22 207 lb 12.8 oz (94.3 kg)  05/26/21 201 lb 9.6 oz (91.4 kg)  12/06/19 197 lb 12.8 oz (89.7 kg)     GEN: Well nourished, well developed in no acute distress HEENT: Normal NECK: No JVD; No carotid bruits CARDIAC: S1/S2, RRR, no murmurs, rubs, gallops; 2+ peripheral pulses throughout, strong and equal bilaterally RESPIRATORY:  Clear and diminished to auscultation without rales, wheezing or rhonchi  MUSCULOSKELETAL:  No edema; No deformity  SKIN: Warm and dry NEUROLOGIC:  Alert and oriented x 3 PSYCHIATRIC:  Normal affect   ASSESSMENT:    1. CAD in native artery   2. Hyperlipidemia LDL goal <70   3. Essential hypertension   4. Erectile dysfunction, unspecified erectile dysfunction type    PLAN:    In order of problems listed above:  Coronary artery disease, history of anterior wall MI in 2012, status post DES to LAD x2 and RCA x1 Stable with  no anginal symptoms. No indication for ischemic evaluation.  EKG today shows previous anterior MI.  We will refill medication upon his request.  He is due for lab work today, will obtain CBC, CMET, and lipid panel.  Continue current medication regimen including aspirin, Coreg, lisinopril, nitroglycerin as needed, and Crestor. Heart healthy diet and regular cardiovascular exercise encouraged.   Hyperlipidemia LDL 1 year ago 37, total cholesterol 78.  He is due  for labs today.  We will obtain FLP within the following week or two. Heart healthy diet and regular cardiovascular exercise encouraged.   Hypertension BP today, 138/72. Has not taken BP meds this morning. BP well controlled at home. Discussed to monitor BP at home at least 2 hours after medications and sitting for 5-10 minutes.  Continue amlodipine, Coreg, and lisinopril.  We will refill medication today.  We will obtain CMET today. Heart healthy diet and regular cardiovascular exercise encouraged.   Erectile dysfunction No issues.  He understands not to take sildenafil and nitroglycerin within 48 hours of one another.  We will refill this upon his request.  5. Disposition: Follow-up with Dr. Burt Knack in 5 to 6 months or sooner if anything changes.   Medication Adjustments/Labs and Tests Ordered: Current medicines are reviewed at length with the patient today.  Concerns regarding medicines are outlined above.  Orders Placed This Encounter  Procedures   CBC   Comp Met (CMET)   Lipid panel   EKG 12-Lead   Meds ordered this encounter  Medications   sildenafil (REVATIO) 20 MG tablet    Sig: Take 2-5 tablets once daily as needed prior to sexual activity.    Dispense:  60 tablet    Refill:  6    This prescription was filled on 03/17/2022. Any refills authorized will be placed on file.   amLODipine (NORVASC) 10 MG tablet    Sig: Take 1 tablet (10 mg total) by mouth daily.    Dispense:  90 tablet    Refill:  3   carvedilol (COREG) 25 MG  tablet    Sig: Take 1 tablet (25 mg total) by mouth 2 (two) times daily with a meal.    Dispense:  180 tablet    Refill:  3   lisinopril (ZESTRIL) 20 MG tablet    Sig: Take 1 tablet (20 mg total) by mouth daily.    Dispense:  90 tablet    Refill:  3   nitroGLYCERIN (NITROSTAT) 0.4 MG SL tablet    Sig: Place 1 tablet (0.4 mg total) under the tongue every 5 (five) minutes as needed.    Dispense:  25 tablet    Refill:  3   rosuvastatin (CRESTOR) 40 MG tablet    Sig: Take 1 tablet (40 mg total) by mouth daily.    Dispense:  90 tablet    Refill:  3    Patient Instructions  Medication Instructions:  Your physician recommends that you continue on your current medications as directed. Please refer to the Current Medication list given to you today.  *If you need a refill on your cardiac medications before your next appointment, please call your pharmacy*   Lab Work: TODAY:  CMET, CBC, & LIPID  If you have labs (blood work) drawn today and your tests are completely normal, you will receive your results only by: [place] (if you have MyChart) OR A paper copy in the mail If you have any lab test that is abnormal or we need to change your treatment, we will call you to review the results.   Testing/Procedures: None ordered   Follow-Up: At [facility], you and your health needs are our priority.  As part of our continuing mission to provide you with exceptional heart care, we have created designated Provider Care Teams.  These Care Teams include your primary Cardiologist (physician) and Advanced Practice Providers (APPs -  Physician Assistants and Nurse Practitioners) who all work together  to provide you with the care you need, when you need it.  We recommend signing up for the patient portal called "MyChart".  Sign up information is provided on this After Visit Summary.  MyChart is used to connect with patients for Virtual Visits (Telemedicine).  Patients are able to view  lab/test results, encounter notes, upcoming appointments, etc.  Non-urgent messages can be sent to your provider as well.   To learn more about what you can do with MyChart, go to [email or web address].    Your next appointment:   5 month(s)  The format for your next appointment:   In Person  Provider:   Sherren Mocha, MD     Other Instructions   Important Information About Sugar         Signed, Finis Bud, NP  08/03/2022 9:55 AM    [place]

## 2022-12-21 ENCOUNTER — Encounter: Payer: Self-pay | Admitting: Cardiovascular Disease

## 2022-12-21 ENCOUNTER — Ambulatory Visit: Payer: 59 | Attending: Cardiovascular Disease | Admitting: Cardiovascular Disease

## 2022-12-21 VITALS — BP 130/98 | HR 67 | Ht 69.0 in | Wt 207.2 lb

## 2022-12-21 DIAGNOSIS — E785 Hyperlipidemia, unspecified: Secondary | ICD-10-CM | POA: Diagnosis not present

## 2022-12-21 DIAGNOSIS — I10 Essential (primary) hypertension: Secondary | ICD-10-CM | POA: Diagnosis not present

## 2022-12-21 DIAGNOSIS — I25810 Atherosclerosis of coronary artery bypass graft(s) without angina pectoris: Secondary | ICD-10-CM | POA: Diagnosis not present

## 2022-12-21 DIAGNOSIS — M7989 Other specified soft tissue disorders: Secondary | ICD-10-CM | POA: Diagnosis not present

## 2022-12-21 NOTE — Progress Notes (Signed)
Cardiology Office Note:    Date:  12/21/2022   ID:  COWAN SOLAZZO, DOB 09/02/1956, MRN [id number]  PCP:  Herbert Pun., MD (Inactive)   [facility] Cardiologist:  Sherren Mocha, MD     Referring MD: No ref. provider found   Chief Complaint  Patient presents with   Coronary Artery Disease    History of Present Illness:    Peter Skinner is a 67 y.o. male with a hx of: HTN CAD, s/p anterior wall MI in 2012, s/p DES x 2 to LAD and DES to RCA HLD Impaired glucose tolerance Tobacco abuse ED  He is here alone today. Overall doing well. 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 Walking regularly without exertional symptoms. Today, he denies symptoms of palpitations, chest pain, shortness of breath, orthopnea, PND,  dizziness, or syncope. He has had a prominent sock line indention, otherwise no major ankle or foot edema noted.    Past Medical History:  Diagnosis Date   CAD (coronary artery disease)    ant STEMI 11/06/10 tx with DES to LAD. dLM 20-30%; mCFX 30-40%; EF 60%. s/p DES to RCA 11/08/10.    ED (erectile dysfunction)    Glucose intolerance (impaired glucose tolerance)    A1C 5.9 1/12   HLD (hyperlipidemia)    HTN (hypertension)     History reviewed. No pertinent surgical history.  Current Medications: Current Meds  Medication Sig   amLODipine (NORVASC) 10 MG tablet Take 1 tablet (10 mg total) by mouth daily.   aspirin 81 MG EC tablet Take 81 mg by mouth daily.     carvedilol (COREG) 25 MG tablet Take 1 tablet (25 mg total) by mouth 2 (two) times daily with a meal.   lisinopril (ZESTRIL) 20 MG tablet Take 1 tablet (20 mg total) by mouth daily.   nitroGLYCERIN (NITROSTAT) 0.4 MG SL tablet Place 1 tablet (0.4 mg total) under the tongue every 5 (five) minutes as needed.   rosuvastatin (CRESTOR) 40 MG tablet Take 1 tablet (40 mg total) by mouth daily.   sildenafil (REVATIO) 20 MG tablet Take 2-5 tablets once daily as needed prior to  sexual activity.     Allergies:   Patient has no known allergies.   Social History   Socioeconomic History   Marital status: Married    Spouse name: Not on file   Number of children: Not on file   Years of education: Not on file   Highest education level: Not on file  Occupational History   Not on file  Tobacco Use   Smoking status: Light Smoker   Smokeless tobacco: Never   Tobacco comments:    quit after MI in 1/12  Vaping Use   Vaping Use: Never used  Substance and Sexual Activity   Alcohol use: Yes    Comment: on weekeds; not a daily drinker.    Drug use: No   Sexual activity: Not on file  Other Topics Concern   Not on file  Social History Narrative   Married, grown children.    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    Social Determinants of Health   Financial Resource Strain: Not on file  Food Insecurity: Not on file  Transportation Needs: Not on file  Physical Activity: Not on file  Stress: Not on file  Social Connections: Not on file     Family History: The patient's family history includes Heart Problems in his mother. There is no  history of Coronary artery disease.  ROS:   Please see the history of present illness.    All other systems reviewed and are negative.  EKGs/Labs/Other Studies Reviewed:    The following studies were reviewed today: No recent CV testing  EKG:  EKG is not ordered today.    Recent Labs: 08/03/2022: ALT 14; BUN 13; Creatinine, Ser 1.18; Hemoglobin 16.9; Platelets 182; Potassium 3.8; Sodium 143  Recent Lipid Panel    Component Value Date/Time   CHOL 90 (L) 08/03/2022 0953   TRIG 61 08/03/2022 0953   HDL 37 (L) 08/03/2022 0953   CHOLHDL 2.4 08/03/2022 0953   CHOLHDL 3.2 03/14/2016 1127   VLDL 15 03/14/2016 1127   LDLCALC 39 08/03/2022 0953     Risk Assessment/Calculations:           Physical Exam:    VS:  BP (!) 130/98 Comment: Left arm 128/90  Pulse 67   Ht '5\' 9"'$  (1.753 m)   Wt 207 lb 3.2 oz (94  kg)   SpO2 96%   BMI 30.60 kg/m     Wt Readings from Last 3 Encounters:  12/21/22 207 lb 3.2 oz (94 kg)  08/03/22 207 lb 12.8 oz (94.3 kg)  05/26/21 201 lb 9.6 oz (91.4 kg)     GEN:  Well nourished, well developed in no acute distress HEENT: Normal NECK: No JVD; No carotid bruits LYMPHATICS: No lymphadenopathy CARDIAC: RRR, no murmurs, rubs, gallops RESPIRATORY:  Clear to auscultation without rales, wheezing or rhonchi  ABDOMEN: Soft, non-tender, non-distended MUSCULOSKELETAL:  No edema; No deformity  SKIN: Warm and dry NEUROLOGIC:  Alert and oriented x 3 PSYCHIATRIC:  Normal affect   ASSESSMENT:    1. Coronary artery disease involving coronary bypass graft of native heart without angina pectoris   2. Essential hypertension   3. Hyperlipidemia LDL goal <70   4. Leg swelling    PLAN:    In order of problems listed above:  The patient is stable without symptoms of angina.  He is greater than a decade out of his acute MI and PCI.  He will continue on aspirin for antiplatelet therapy, and ACE inhibitor, beta-blocker, and a high intensity statin drug.  I recommended an echocardiogram to assess LV function. Blood pressure above goal today.  He states that he has not taken any of his antihypertensive medication yet this morning.  Will continue current management.  Advised him to monitor his blood pressure at home.  Continue amlodipine, carvedilol, lisinopril. Treated with rosuvastatin 40 mg daily.  Lipids from October showed a cholesterol of 90, HDL 37, LDL 39. Minimal swelling noted, he notices a prominent sock indention.  Suspect this is related to amlodipine.  Will check an echocardiogram to make sure that he does not have signs of hypertensive heart disease or ischemic cardiomyopathy from his old MI.           Medication Adjustments/Labs and Tests Ordered: Current medicines are reviewed at length with the patient today.  Concerns regarding medicines are outlined above.  No  orders of the defined types were placed in this encounter.  No orders of the defined types were placed in this encounter.   There are no Patient Instructions on file for this visit.   Signed, Sherren Mocha, MD  12/21/2022 11:30 AM    [place]

## 2022-12-21 NOTE — Patient Instructions (Signed)
Medication Instructions:  Your physician recommends that you continue on your current medications as directed. Please refer to the Current Medication list given to you today.  *If you need a refill on your cardiac medications before your next appointment, please call your pharmacy*   Lab Work: CBC, CMET, Lipids (prior to next year's appt) If you have labs (blood work) drawn today and your tests are completely normal, you will receive your results only by: [place] (if you have MyChart) OR A paper copy in the mail If you have any lab test that is abnormal or we need to change your treatment, we will call you to review the results.  Testing/Procedures: ECHO Your physician has requested that you have an echocardiogram. Echocardiography is a painless test that uses sound waves to create images of your heart. It provides your doctor with information about the size and shape of your heart and how well your heart's chambers and valves are working. This procedure takes approximately one hour. There are no restrictions for this procedure. Please do NOT wear cologne, perfume, aftershave, or lotions (deodorant is allowed). Please arrive 15 minutes prior to your appointment time.  Follow-Up: At [facility], you and your health needs are our priority.  As part of our continuing mission to provide you with exceptional heart care, we have created designated Provider Care Teams.  These Care Teams include your primary Cardiologist (physician) and Advanced Practice Providers (APPs -  Physician Assistants and Nurse Practitioners) who all work together to provide you with the care you need, when you need it.  Your next appointment:   1 year(s)  Provider:   Sherren Mocha, MD

## 2023-01-23 ENCOUNTER — Ambulatory Visit (HOSPITAL_COMMUNITY): Payer: 59 | Attending: Cardiovascular Disease

## 2023-01-23 ENCOUNTER — Other Ambulatory Visit (HOSPITAL_COMMUNITY): Payer: 59

## 2023-01-23 DIAGNOSIS — I25810 Atherosclerosis of coronary artery bypass graft(s) without angina pectoris: Secondary | ICD-10-CM | POA: Insufficient documentation

## 2023-01-23 DIAGNOSIS — E785 Hyperlipidemia, unspecified: Secondary | ICD-10-CM | POA: Insufficient documentation

## 2023-01-23 DIAGNOSIS — I10 Essential (primary) hypertension: Secondary | ICD-10-CM | POA: Insufficient documentation

## 2023-01-23 DIAGNOSIS — M7989 Other specified soft tissue disorders: Secondary | ICD-10-CM | POA: Diagnosis not present

## 2023-01-23 LAB — ECHOCARDIOGRAM COMPLETE
Area-P 1/2: 3.72 cm2
Calc EF: 57.2 %
S' Lateral: 2.8 cm
Single Plane A2C EF: 58.1 %
Single Plane A4C EF: 58.8 %

## 2023-04-19 ENCOUNTER — Telehealth: Payer: Self-pay

## 2023-04-19 NOTE — Telephone Encounter (Signed)
[facility] is requesting a refill on sildenafil 20 mg tablets. Would Dr. Excell Seltzer like to refill this medication? Please address

## 2023-04-20 MED ORDER — SILDENAFIL CITRATE 20 MG PO TABS: ORAL_TABLET | ORAL | 5 refills | Status: DC

## 2023-04-20 NOTE — Telephone Encounter (Signed)
Pt seen in clinic for annual follow-up by Excell Seltzer on 12/21/22. Sildenafil was on med list at that visit and no changes made to medication regimen. Advised to follow-up in one year. Will refill medication at this time.

## 2023-08-10 ENCOUNTER — Other Ambulatory Visit: Payer: Self-pay | Admitting: Nurse Practitioner

## 2023-09-09 ENCOUNTER — Other Ambulatory Visit: Payer: Self-pay | Admitting: Nurse Practitioner

## 2023-09-12 ENCOUNTER — Other Ambulatory Visit: Payer: Self-pay

## 2023-09-12 MED ORDER — ROSUVASTATIN CALCIUM 40 MG PO TABS: 40.0000 mg | ORAL_TABLET | Freq: Every day | ORAL | 0 refills | Status: DC

## 2023-10-23 ENCOUNTER — Other Ambulatory Visit: Payer: 59

## 2023-10-23 DIAGNOSIS — I10 Essential (primary) hypertension: Secondary | ICD-10-CM

## 2023-10-23 DIAGNOSIS — E785 Hyperlipidemia, unspecified: Secondary | ICD-10-CM

## 2023-10-23 DIAGNOSIS — M7989 Other specified soft tissue disorders: Secondary | ICD-10-CM

## 2023-10-23 DIAGNOSIS — I25810 Atherosclerosis of coronary artery bypass graft(s) without angina pectoris: Secondary | ICD-10-CM

## 2023-11-12 ENCOUNTER — Other Ambulatory Visit: Payer: Self-pay | Admitting: Cardiovascular Disease

## 2023-11-13 ENCOUNTER — Telehealth: Payer: Self-pay | Admitting: Cardiovascular Disease

## 2023-11-13 MED ORDER — CARVEDILOL 25 MG PO TABS: 25.0000 mg | ORAL_TABLET | Freq: Two times a day (BID) | ORAL | 0 refills | Status: DC

## 2023-11-13 NOTE — Telephone Encounter (Signed)
*  STAT* If patient is at the pharmacy, call can be transferred to refill team.   1. Which medications need to be refilled? (please list name of each medication and dose if known)   carvedilol (COREG) 25 MG tablet    2. Which pharmacy/location (including street and city if local pharmacy) is medication to be sent to?  [id number] - [place], [place] - [address]    3. Do they need a 30 day or 90 day supply? 90  Patient is completely out of this medication.

## 2023-11-13 NOTE — Telephone Encounter (Signed)
Pt's medication was sent to pt's pharmacy as requested. Confirmation received.  °

## 2023-12-19 ENCOUNTER — Other Ambulatory Visit: Payer: Self-pay | Admitting: Cardiovascular Disease

## 2023-12-22 ENCOUNTER — Other Ambulatory Visit: Payer: Self-pay | Admitting: Cardiovascular Disease

## 2023-12-22 MED ORDER — ROSUVASTATIN CALCIUM 40 MG PO TABS: 40.0000 mg | ORAL_TABLET | Freq: Every day | ORAL | 0 refills | Status: DC

## 2023-12-22 NOTE — Addendum Note (Signed)
 Addended by: Adriana Simas, Donye Campanelli L on: 12/22/2023 02:30 PM   Modules accepted: Orders

## 2024-02-09 ENCOUNTER — Other Ambulatory Visit: Payer: Self-pay | Admitting: Cardiovascular Disease

## 2024-02-12 ENCOUNTER — Telehealth: Payer: Self-pay | Admitting: Cardiovascular Disease

## 2024-02-12 NOTE — Telephone Encounter (Signed)
 Pt c/o medication issue:  1. Name of Medication:   carvedilol  (COREG ) 25 MG tablet    2. How are you currently taking this medication (dosage and times per day)? Has not taken it due to being out   3. Are you having a reaction (difficulty breathing--STAT)? No   4. What is your medication issue? Patient is calling stating his pharmacy advised he cannot get this medication til Thursday, but he is out now. Please advise.

## 2024-02-12 NOTE — Telephone Encounter (Signed)
 Spoke with patient and he states he received a text that it was too early to refill his carvedilol  and he wanted to know why.   Informed patient he will need to contact his pharmacy

## 2024-03-06 NOTE — Progress Notes (Unsigned)
 Cardiology Office Note    Patient Name: Peter Skinner Date of Encounter: 03/06/2024  Primary Care Provider:  Cherie Corin., MD (Inactive) Primary Cardiologist:  Arnoldo Lapping, MD Primary Electrophysiologist: None   Past Medical History    Past Medical History:  Diagnosis Date   CAD (coronary artery disease)    ant STEMI 11/06/10 tx with DES to LAD. dLM 20-30%; mCFX 30-40%; EF 60%. s/p DES to RCA 11/08/10.    ED (erectile dysfunction)    Glucose intolerance (impaired glucose tolerance)    A1C 5.9 1/12   HLD (hyperlipidemia)    HTN (hypertension)     History of Present Illness  Peter Skinner is a 68 y.o. male with a PMH of CAD s/p anterior MI with DES x 2 to LAD and RCA in 2012, HLD, HTN, tobacco abuse who presents today for annual follow-up.  Peter Skinner was seen initially in 2012 when he presented to the ED with an anterior MI.  He underwent an emergent LHC and was started on DAPT.  He was seen in follow-up in 2015 with blood pressure not at goal and was started on carvedilol  twice daily.  He has done well from a cardiac perspective and was last seen by Dr. Arlester Ladd on 12/21/2022 for follow-up.  During that time he was still working as an Designer, television/film set at a tobacco plant and reported no symptoms of angina.  He underwent a updated 2D echo due to lower extremity swelling that showed normal EF of 60 to 65% with grade 2 DD and mild LVH with mildly dilated LA and no significant valve abnormalities.  Patient denies chest pain, palpitations, dyspnea, PND, orthopnea, nausea, vomiting, dizziness, syncope, edema, weight gain, or early satiety.   Discussed the use of AI scribe software for clinical note transcription with the patient, who gave verbal consent to proceed.  History of Present Illness    ***Notes:   Review of Systems  Please see the history of present illness.    All other systems reviewed and are otherwise negative except as noted above.  Physical Exam     Wt  Readings from Last 3 Encounters:  12/21/22 207 lb 3.2 oz (94 kg)  08/03/22 207 lb 12.8 oz (94.3 kg)  05/26/21 201 lb 9.6 oz (91.4 kg)   ZO:[id number] were no vitals filed for this visit.,There is no height or weight on file to calculate BMI. GEN: Well nourished, well developed in no acute distress Neck: No JVD; No carotid bruits Pulmonary: Clear to auscultation without rales, wheezing or rhonchi  Cardiovascular: Normal rate. Regular rhythm. Normal S1. Normal S2.   Murmurs: There is no murmur.  ABDOMEN: Soft, non-tender, non-distended EXTREMITIES:  No edema; No deformity   EKG/LABS/ Recent Cardiac Studies   ECG personally reviewed by me today - ***  Risk Assessment/Calculations:   {Does this patient have ATRIAL FIBRILLATION?:[id number]}      Lab Results  Component Value Date   WBC 3.6 08/03/2022   HGB 16.9 08/03/2022   HCT 50.6 08/03/2022   MCV 82 08/03/2022   PLT 182 08/03/2022   Lab Results  Component Value Date   CREATININE 1.18 08/03/2022   BUN 13 08/03/2022   NA 143 08/03/2022   K 3.8 08/03/2022   CL 105 08/03/2022   CO2 26 08/03/2022   Lab Results  Component Value Date   CHOL 90 (L) 08/03/2022   HDL 37 (L) 08/03/2022   LDLCALC 39 08/03/2022   TRIG 61 08/03/2022   CHOLHDL 2.4  08/03/2022    Lab Results  Component Value Date   HGBA1C (H) 11/06/2010    5.9 (NOTE)                                                                       According to the ADA Clinical Practice Recommendations for 2011, when HbA1c is used as a screening test:   >=6.5%   Diagnostic of Diabetes Mellitus           (if abnormal result  is confirmed)  5.7-6.4%   Increased risk of developing Diabetes Mellitus  References:Diagnosis and Classification of Diabetes Mellitus,Diabetes Care,2011,34(Suppl 1):S62-S69 and Standards of Medical Care in         Diabetes - 2011,Diabetes Care,2011,34  (Suppl 1):S11-S61.   Assessment & Plan    Assessment and Plan Assessment & Plan     1.  CAD  2.   Primary hypertension  3.  Hyperlipidemia  4.  Tobacco abuse      Disposition: Follow-up with Arnoldo Lapping, MD or APP in *** months {Are you ordering a CV Procedure (e.g. stress test, cath, DCCV, TEE, etc)?   Press F2        :[id number]}   Signed, Francene Ing, Retha Cast, NP 03/06/2024, 7:09 AM [facility]

## 2024-03-07 ENCOUNTER — Encounter: Payer: Self-pay | Admitting: Nurse Practitioner

## 2024-03-07 ENCOUNTER — Ambulatory Visit: Attending: Nurse Practitioner | Admitting: Nurse Practitioner

## 2024-03-07 VITALS — BP 128/64 | HR 77 | Ht 69.0 in | Wt 199.0 lb

## 2024-03-07 DIAGNOSIS — Z720 Tobacco use: Secondary | ICD-10-CM

## 2024-03-07 DIAGNOSIS — I25810 Atherosclerosis of coronary artery bypass graft(s) without angina pectoris: Secondary | ICD-10-CM | POA: Diagnosis not present

## 2024-03-07 DIAGNOSIS — E785 Hyperlipidemia, unspecified: Secondary | ICD-10-CM | POA: Diagnosis not present

## 2024-03-07 DIAGNOSIS — I10 Essential (primary) hypertension: Secondary | ICD-10-CM | POA: Diagnosis not present

## 2024-03-07 LAB — COMPREHENSIVE METABOLIC PANEL WITH GFR
ALT: 11 IU/L (ref 0–44)
AST: 16 IU/L (ref 0–40)
Albumin: 3.7 g/dL — ABNORMAL LOW (ref 3.9–4.9)
Alkaline Phosphatase: 68 IU/L (ref 44–121)
BUN/Creatinine Ratio: 15 (ref 10–24)
BUN: 16 mg/dL (ref 8–27)
Bilirubin Total: 0.3 mg/dL (ref 0.0–1.2)
CO2: 21 mmol/L (ref 20–29)
Calcium: 9 mg/dL (ref 8.6–10.2)
Chloride: 106 mmol/L (ref 96–106)
Creatinine, Ser: 1.07 mg/dL (ref 0.76–1.27)
Globulin, Total: 2.8 g/dL (ref 1.5–4.5)
Glucose: 94 mg/dL (ref 70–99)
Potassium: 4 mmol/L (ref 3.5–5.2)
Sodium: 142 mmol/L (ref 134–144)
Total Protein: 6.5 g/dL (ref 6.0–8.5)
eGFR: 77 mL/min/{1.73_m2} (ref >59)

## 2024-03-07 LAB — LIPID PANEL
Chol/HDL Ratio: 3.2 ratio (ref 0.0–5.0)
Cholesterol, Total: 81 mg/dL — ABNORMAL LOW (ref 100–199)
HDL: 25 mg/dL — ABNORMAL LOW (ref >39)
LDL Chol Calc (NIH): 34 mg/dL (ref 0–99)
Triglycerides: 121 mg/dL (ref 0–149)
VLDL Cholesterol Cal: 22 mg/dL (ref 5–40)

## 2024-03-07 MED ORDER — NITROGLYCERIN 0.4 MG SL SUBL: 0.4000 mg | SUBLINGUAL_TABLET | SUBLINGUAL | 3 refills | Status: AC | PRN

## 2024-03-07 MED ORDER — CARVEDILOL 25 MG PO TABS: 25.0000 mg | ORAL_TABLET | Freq: Two times a day (BID) | ORAL | 3 refills | Status: AC

## 2024-03-07 MED ORDER — AMLODIPINE BESYLATE 10 MG PO TABS: 10.0000 mg | ORAL_TABLET | Freq: Every day | ORAL | 0 refills | Status: DC

## 2024-03-07 MED ORDER — LISINOPRIL 20 MG PO TABS: 20.0000 mg | ORAL_TABLET | Freq: Every day | ORAL | 3 refills | Status: AC

## 2024-03-07 MED ORDER — ROSUVASTATIN CALCIUM 40 MG PO TABS: 40.0000 mg | ORAL_TABLET | Freq: Every day | ORAL | 3 refills | Status: AC

## 2024-03-07 NOTE — Patient Instructions (Signed)
 Medication Instructions:  Continue all Medications *If you need a refill on your cardiac medications before your next appointment, please call your pharmacy*  Lab Work: CMET and Lipids If you have labs (blood work) drawn today and your tests are completely normal, you will receive your results only by: MyChart Message (if you have MyChart) OR A paper copy in the mail If you have any lab test that is abnormal or we need to change your treatment, we will call you to review the results.  Testing/Procedures: None  Follow-Up: At [facility], you and your health needs are our priority.  As part of our continuing mission to provide you with exceptional heart care, our providers are all part of one team.  This team includes your primary Cardiologist (physician) and Advanced Practice Providers or APPs (Physician Assistants and Nurse Practitioners) who all work together to provide you with the care you need, when you need it.  Your next appointment:   12 month(s)  Provider:   Arnoldo Lapping, MD    We recommend signing up for the patient portal called "MyChart".  Sign up information is provided on this After Visit Summary.  MyChart is used to connect with patients for Virtual Visits (Telemedicine).  Patients are able to view lab/test results, encounter notes, upcoming appointments, etc.  Non-urgent messages can be sent to your provider as well.   To learn more about what you can do with MyChart, go to [email or web address].   Other Instructions Increase activity with at least 150 minutes Per Week

## 2024-03-08 ENCOUNTER — Ambulatory Visit: Payer: Self-pay | Admitting: Nurse Practitioner

## 2024-04-25 ENCOUNTER — Encounter: Payer: Self-pay | Admitting: Student in an Organized Health Care Education/Training Program

## 2024-04-25 ENCOUNTER — Ambulatory Visit: Admitting: Student in an Organized Health Care Education/Training Program

## 2024-04-25 VITALS — BP 117/47 | HR 71 | Wt 192.4 lb

## 2024-04-25 DIAGNOSIS — H6123 Impacted cerumen, bilateral: Secondary | ICD-10-CM

## 2024-04-25 DIAGNOSIS — R490 Dysphonia: Secondary | ICD-10-CM | POA: Insufficient documentation

## 2024-04-25 DIAGNOSIS — Z131 Encounter for screening for diabetes mellitus: Secondary | ICD-10-CM

## 2024-04-25 DIAGNOSIS — R7303 Prediabetes: Secondary | ICD-10-CM | POA: Insufficient documentation

## 2024-04-25 DIAGNOSIS — F17200 Nicotine dependence, unspecified, uncomplicated: Secondary | ICD-10-CM

## 2024-04-25 DIAGNOSIS — I2510 Atherosclerotic heart disease of native coronary artery without angina pectoris: Secondary | ICD-10-CM

## 2024-04-25 DIAGNOSIS — I10 Essential (primary) hypertension: Secondary | ICD-10-CM

## 2024-04-25 LAB — POCT GLYCOSYLATED HEMOGLOBIN (HGB A1C): Hemoglobin A1C: 6 % — AB (ref 4.0–5.6)

## 2024-04-25 MED ORDER — VARENICLINE TARTRATE 1 MG PO TABS: ORAL_TABLET | ORAL | 1 refills | Status: AC

## 2024-04-25 NOTE — Assessment & Plan Note (Signed)
 History of ischemic heart disease with an MI event in 2012 treated with PCI.  Doing very well since then.  Managed with cardiology.  Planning to continue aspirin, rosuvastatin , carvedilol , and lisinopril .  Working on smoking cessation today.

## 2024-04-25 NOTE — Assessment & Plan Note (Signed)
 Chronic issue.  Has been smoking about 30 years and on average 1 pack/day.  Tobacco use complicated by ischemic heart disease, now has new hoarseness.  He has some interest in quitting.  Has not tried to quit recently.  His daughter has been encouraging him to quit.  We decided to try Chantix, talked about the risks.

## 2024-04-25 NOTE — Progress Notes (Signed)
 New Patient Office Visit  Subjective    Patient ID: Peter Skinner, male    DOB: 09-13-1956  Age: 68 y.o. MRN: [id number]  CC:  Chief Complaint  Patient presents with   Establish Care    Patient is here to establish care. His only complaint is that he has an issue with his voice being raspy and going in and out. He was told it was allergies and he completed methylprednisolone with little help.    Constipation    Has issues with constipation at times. He has only tried to eat prunes, or take chewables for relief with no help.     HPI  Peter Skinner presents to establish care  68 year old person here for evaluation of a hoarse voice that has been present for 2 weeks.  Came on pretty suddenly, did not really notice.  Evaluated at an urgent care, prescribed methylprednisolone, completed the box but has had no improvement.  Says the hoarseness comes and goes.  Denies any dysphagia or odynophagia.  No cough or hemoptysis.  He has a 7 pound unintentional weight loss over the last 2 months.  30-pack-year history of tobacco use.  History of an MI in 2012 managed with PCI.  Still manage with cardiology, good adherence with his medications.  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  Interested in retiring next year.  Not much exercise, denies shortness of breath or chest pain with exertion.  No significant alcohol use.  Otherwise very functional person.  No recent illness.  No fevers or chills.  No recent hospitalizations or ED visits.    Outpatient Encounter Medications as of 04/25/2024  Medication Sig   amLODipine  (NORVASC ) 10 MG tablet Take 1 tablet (10 mg total) by mouth daily.   aspirin 81 MG EC tablet Take 81 mg by mouth daily.     carvedilol  (COREG ) 25 MG tablet Take 1 tablet (25 mg total) by mouth 2 (two) times daily with a meal.   lisinopril  (ZESTRIL ) 20 MG tablet Take 1 tablet (20 mg total) by mouth daily.   nitroGLYCERIN  (NITROSTAT ) 0.4 MG SL tablet Place 1 tablet  (0.4 mg total) under the tongue every 5 (five) minutes as needed.   rosuvastatin  (CRESTOR ) 40 MG tablet Take 1 tablet (40 mg total) by mouth daily.   sildenafil  (REVATIO ) 20 MG tablet Take 2-5 tablets once daily as needed prior to sexual activity.   varenicline (CHANTIX) 1 MG tablet Take 0.5 tablets (0.5 mg total) by mouth daily for 3 days, THEN 0.5 tablets (0.5 mg total) 2 (two) times daily for 4 days, THEN 1 tablet (1 mg total) 2 (two) times daily.   No facility-administered encounter medications on file as of 04/25/2024.    Past Medical History:  Diagnosis Date   CAD (coronary artery disease)    ant STEMI 11/06/10 tx with DES to LAD. dLM 20-30%; mCFX 30-40%; EF 60%. s/p DES to RCA 11/08/10.    ED (erectile dysfunction)    Glucose intolerance (impaired glucose tolerance)    A1C 5.9 1/12   HLD (hyperlipidemia)    HTN (hypertension)     History reviewed. No pertinent surgical history.  Family History  Problem Relation Age of Onset   Heart Problems Mother    Coronary artery disease Neg Hx        premature CAD       Objective    BP (!) 117/47   Pulse 71   Wt 192  lb 6.4 oz (87.3 kg)   SpO2 100%   BMI 28.41 kg/m   Physical Exam  Gen: Well-appearing man Eyes: Normal Ears: Impacted with cerumen bilaterally, cleared out with a curette, still wax adherent to the eardrums bilaterally Mouth: Normal, no lesions or ulcerations on the tongue or palate, cannot visualize past the upper tonsils Neck: No adenopathy, normal thyroid, no nodules, nontender Heart: Regular, no murmur Lungs: Unlabored, clear throughout Abd: Soft, nontender, no organomegaly Ext: Warm, no edema, normal joints Psych: Mildly anxious appearing, pleasant to talk with, not depressed appearing, 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  Has been smoking about 30 years and on average 1 pack/day.  Tobacco use complicated by ischemic heart disease, now has new hoarseness.  He has some interest in quitting.  Has not tried to quit recently.  His daughter has been encouraging him to quit.  We decided to try Chantix, talked about the risks.       Relevant Medications   varenicline (CHANTIX) 1 MG tablet   Essential hypertension (Chronic)   Chronic and stable.  Blood pressure well-controlled today.  Plan to continue amlodipine  and lisinopril .  Blood work from May is appropriate.      CAD (coronary artery disease) (Chronic)   History of ischemic heart disease with an MI event in 2012 treated with PCI.  Doing very well since then.  Managed with cardiology.  Planning to continue aspirin, rosuvastatin , carvedilol , and lisinopril .  Working on smoking cessation today.      Hoarseness of voice - Primary (Chronic)   New problem.  Present for about 2 weeks.  Given his 30-pack-year smoking history, he is at increased risk for a laryngeal squamous cell carcinoma.  Exam of his mouth and tongue are normal today.  Will refer to ENT for direct visualization of the vocal cords.  No direct history of phonotrauma or esophageal reflux.  He has mild sinus congestion so may have postnasal drip causing a chronic laryngitis, but I would have thoughts the steroids would help if that were the case.  I think most important is for direct visualization to rule out malignancy.      Relevant Orders   Ambulatory referral to ENT     Medium    Prediabetes (Chronic)   Hemoglobin A1c elevated at 6.0%.  At risk for diabetes in the future.  Will plan to recheck A1c in 6-12 months.        Low   Bilateral impacted cerumen (Chronic)   History of impacted cerumen in both ears, they are again impacted today.  He wears earplugs at work which is likely the culprit.  I used a curette and was able to clear out  much of the impaction but he still has residual wax adherent to the eardrums.  He has worked with ENT in the past for cerumen suction.  Procedure Note: Manual Removal of Impacted Cerumen Using a Curette   Indication:  Cerumen impaction causing symptoms (e.g., hearing loss, pain, tinnitus) or preventing assessment of the ear canal and tympanic membrane.  Procedure:  Explained the procedure to the patient and informed consent was obtained  Review patient history for contraindications (e.g., nonintact tympanic membrane, history of ear surgery, anatomical  abnormalities).[1]  After a position of the patient's head upright, I visualized the ear canal and cerumen using an otoscope.  I gently inserted the curette into the ear canal avoiding contact with the canal walls, and carefully scooped the cerumen, removing it in small pieces.  I reassessed the ear canal and tympanic membrane, there was no residual cerumen nor signs of trauma.  Follow-Up:  I instructed the patient to report any persistent symptoms such as pain, discharge, or hearing loss.  Schedule a follow-up appointment if necessary.       Other Visit Diagnoses       Screening for diabetes mellitus       Relevant Orders   POCT glycosylated hemoglobin (Hb A1C) (Completed)       Return in about 3 months (around 07/26/2024).   Cleatus Debby Specking, MD

## 2024-04-25 NOTE — Assessment & Plan Note (Signed)
 Chronic and stable.  Blood pressure well-controlled today.  Plan to continue amlodipine  and lisinopril .  Blood work from May is appropriate.

## 2024-04-25 NOTE — Assessment & Plan Note (Signed)
 Hemoglobin A1c elevated at 6.0%.  At risk for diabetes in the future.  Will plan to recheck A1c in 6-12 months.

## 2024-04-25 NOTE — Assessment & Plan Note (Addendum)
 New problem.  Present for about 2 weeks.  Given his 30-pack-year smoking history, he is at increased risk for a laryngeal squamous cell carcinoma.  Exam of his mouth and tongue are normal today.  Will refer to ENT for direct visualization of the vocal cords.  No direct history of phonotrauma or esophageal reflux.  He has mild sinus congestion so may have postnasal drip causing a chronic laryngitis, but I would have thoughts the steroids would help if that were the case.  I think most important is for direct visualization to rule out malignancy.

## 2024-04-25 NOTE — Assessment & Plan Note (Signed)
 History of impacted cerumen in both ears, they are again impacted today.  He wears earplugs at work which is likely the culprit.  I used a curette and was able to clear out much of the impaction but he still has residual wax adherent to the eardrums.  He has worked with ENT in the past for cerumen suction.  Procedure Note: Manual Removal of Impacted Cerumen Using a Curette   Indication:  Cerumen impaction causing symptoms (e.g., hearing loss, pain, tinnitus) or preventing assessment of the ear canal and tympanic membrane.  Procedure:  Explained the procedure to the patient and informed consent was obtained  Review patient history for contraindications (e.g., nonintact tympanic membrane, history of ear surgery, anatomical abnormalities).[1]  After a position of the patient's head upright, I visualized the ear canal and cerumen using an otoscope.  I gently inserted the curette into the ear canal avoiding contact with the canal walls, and carefully scooped the cerumen, removing it in small pieces.  I reassessed the ear canal and tympanic membrane, there was no residual cerumen nor signs of trauma.  Follow-Up:  I instructed the patient to report any persistent symptoms such as pain, discharge, or hearing loss.  Schedule a follow-up appointment if necessary.

## 2024-05-10 ENCOUNTER — Other Ambulatory Visit: Payer: Self-pay | Admitting: Cardiovascular Disease

## 2024-05-10 NOTE — Telephone Encounter (Signed)
 Pt's pharmacy is requesting a refill on non cardiac medication sildenafil . Would Dr. Wonda like to refill this medication? Please address

## 2024-05-16 NOTE — Telephone Encounter (Signed)
 Pt seen by Jackee Alberts, NP on 03/07/24 with no medication changes made and recommendation to follow-up in 1 year. Refill sent to pharmacy for sildenafil  at this time.

## 2024-07-26 ENCOUNTER — Ambulatory Visit: Admitting: Student in an Organized Health Care Education/Training Program

## 2024-09-08 ENCOUNTER — Other Ambulatory Visit: Payer: Self-pay | Admitting: Nurse Practitioner
